# Patient Record
Sex: Male | Born: 1940
Health system: Southern US, Community
[De-identification: ages and names within clinical notes are randomized; demographics above are authoritative.]

## PROBLEM LIST (undated history)

## (undated) DIAGNOSIS — N200 Calculus of kidney: Secondary | ICD-10-CM

---

## 1999-05-09 ENCOUNTER — Emergency Department (HOSPITAL_COMMUNITY): Admission: EM | Admit: 1999-05-09 | Discharge: 1999-05-09 | Payer: Self-pay | Admitting: Internal Medicine

## 1999-05-09 ENCOUNTER — Encounter: Payer: Self-pay | Admitting: Internal Medicine

## 1999-06-12 ENCOUNTER — Ambulatory Visit (HOSPITAL_COMMUNITY): Admission: RE | Admit: 1999-06-12 | Discharge: 1999-06-12 | Payer: Self-pay | Admitting: Urology

## 1999-06-12 ENCOUNTER — Encounter: Payer: Self-pay | Admitting: Urology

## 2005-06-25 ENCOUNTER — Ambulatory Visit (HOSPITAL_BASED_OUTPATIENT_CLINIC_OR_DEPARTMENT_OTHER): Admission: RE | Admit: 2005-06-25 | Discharge: 2005-06-25 | Payer: Self-pay

## 2005-06-25 ENCOUNTER — Ambulatory Visit (HOSPITAL_COMMUNITY): Admission: RE | Admit: 2005-06-25 | Discharge: 2005-06-25 | Payer: Self-pay

## 2011-12-20 DIAGNOSIS — H4011X0 Primary open-angle glaucoma, stage unspecified: Secondary | ICD-10-CM | POA: Diagnosis not present

## 2011-12-20 DIAGNOSIS — H409 Unspecified glaucoma: Secondary | ICD-10-CM | POA: Diagnosis not present

## 2012-07-10 DIAGNOSIS — H409 Unspecified glaucoma: Secondary | ICD-10-CM | POA: Diagnosis not present

## 2012-07-10 DIAGNOSIS — H4011X0 Primary open-angle glaucoma, stage unspecified: Secondary | ICD-10-CM | POA: Diagnosis not present

## 2012-07-14 DIAGNOSIS — Z23 Encounter for immunization: Secondary | ICD-10-CM | POA: Diagnosis not present

## 2013-01-25 DIAGNOSIS — H409 Unspecified glaucoma: Secondary | ICD-10-CM | POA: Diagnosis not present

## 2013-01-25 DIAGNOSIS — H4011X0 Primary open-angle glaucoma, stage unspecified: Secondary | ICD-10-CM | POA: Diagnosis not present

## 2013-06-11 DIAGNOSIS — Z125 Encounter for screening for malignant neoplasm of prostate: Secondary | ICD-10-CM | POA: Diagnosis not present

## 2013-06-11 DIAGNOSIS — L821 Other seborrheic keratosis: Secondary | ICD-10-CM | POA: Diagnosis not present

## 2013-06-11 DIAGNOSIS — E785 Hyperlipidemia, unspecified: Secondary | ICD-10-CM | POA: Diagnosis not present

## 2013-06-14 DIAGNOSIS — H26499 Other secondary cataract, unspecified eye: Secondary | ICD-10-CM | POA: Diagnosis not present

## 2013-06-15 ENCOUNTER — Emergency Department (HOSPITAL_COMMUNITY)
Admission: EM | Admit: 2013-06-15 | Discharge: 2013-06-15 | Disposition: A | Discharge status: Home / Self Care | Payer: Medicare Other | Attending: Emergency Medicine | Admitting: Emergency Medicine

## 2013-06-15 ENCOUNTER — Encounter (HOSPITAL_COMMUNITY): Payer: Self-pay | Admitting: Emergency Medicine

## 2013-06-15 ENCOUNTER — Emergency Department (HOSPITAL_COMMUNITY): Payer: Medicare Other

## 2013-06-15 DIAGNOSIS — N200 Calculus of kidney: Secondary | ICD-10-CM | POA: Insufficient documentation

## 2013-06-15 DIAGNOSIS — R944 Abnormal results of kidney function studies: Secondary | ICD-10-CM | POA: Diagnosis not present

## 2013-06-15 DIAGNOSIS — N201 Calculus of ureter: Secondary | ICD-10-CM | POA: Diagnosis not present

## 2013-06-15 DIAGNOSIS — N400 Enlarged prostate without lower urinary tract symptoms: Secondary | ICD-10-CM | POA: Diagnosis not present

## 2013-06-15 DIAGNOSIS — R7989 Other specified abnormal findings of blood chemistry: Secondary | ICD-10-CM

## 2013-06-15 DIAGNOSIS — K7689 Other specified diseases of liver: Secondary | ICD-10-CM | POA: Diagnosis not present

## 2013-06-15 DIAGNOSIS — N23 Unspecified renal colic: Secondary | ICD-10-CM | POA: Diagnosis not present

## 2013-06-15 DIAGNOSIS — K769 Liver disease, unspecified: Secondary | ICD-10-CM

## 2013-06-15 DIAGNOSIS — R911 Solitary pulmonary nodule: Secondary | ICD-10-CM | POA: Diagnosis not present

## 2013-06-15 HISTORY — DX: Calculus of kidney: N20.0

## 2013-06-15 LAB — POCT I-STAT TROPONIN I

## 2013-06-15 LAB — CBC WITH DIFFERENTIAL/PLATELET
Eosinophils Absolute: 0.1 10*3/uL (ref 0.0–0.7)
HCT: 44.2 % (ref 39.0–52.0)
Hemoglobin: 15.1 g/dL (ref 13.0–17.0)
Lymphocytes Relative: 7 % — ABNORMAL LOW (ref 12–46)
Lymphs Abs: 0.9 10*3/uL (ref 0.7–4.0)
MCH: 30.5 pg (ref 26.0–34.0)
Monocytes Absolute: 0.6 10*3/uL (ref 0.1–1.0)
Monocytes Relative: 5 % (ref 3–12)
Neutro Abs: 10.8 10*3/uL — ABNORMAL HIGH (ref 1.7–7.7)
Neutrophils Relative %: 88 % — ABNORMAL HIGH (ref 43–77)
Platelets: 194 10*3/uL (ref 150–400)
RBC: 4.95 MIL/uL (ref 4.22–5.81)
WBC: 12.3 10*3/uL — ABNORMAL HIGH (ref 4.0–10.5)

## 2013-06-15 LAB — URINE MICROSCOPIC-ADD ON

## 2013-06-15 LAB — COMPREHENSIVE METABOLIC PANEL
ALT: 19 U/L (ref 0–53)
Alkaline Phosphatase: 45 U/L (ref 39–117)
BUN: 19 mg/dL (ref 6–23)
Calcium: 9.3 mg/dL (ref 8.4–10.5)
Chloride: 105 mEq/L (ref 96–112)
GFR calc Af Amer: 51 mL/min — ABNORMAL LOW (ref >90)
Glucose, Bld: 134 mg/dL — ABNORMAL HIGH (ref 70–99)
Potassium: 4.2 mEq/L (ref 3.5–5.1)
Sodium: 140 mEq/L (ref 135–145)
Total Bilirubin: 0.4 mg/dL (ref 0.3–1.2)

## 2013-06-15 LAB — URINALYSIS, ROUTINE W REFLEX MICROSCOPIC
Bilirubin Urine: NEGATIVE
Ketones, ur: NEGATIVE mg/dL
Leukocytes, UA: NEGATIVE
Nitrite: NEGATIVE
Specific Gravity, Urine: 1.023 (ref 1.005–1.030)
pH: 6 (ref 5.0–8.0)

## 2013-06-15 LAB — LIPASE, BLOOD: Lipase: 41 U/L (ref 11–59)

## 2013-06-15 MED ORDER — SODIUM CHLORIDE 0.9 % IV BOLUS (SEPSIS)
1000.0000 mL | Freq: Once | INTRAVENOUS | Status: AC
  Administered 2013-06-15: 1000 mL via INTRAVENOUS

## 2013-06-15 MED ORDER — TAMSULOSIN HCL 0.4 MG PO CAPS
0.4000 mg | ORAL_CAPSULE | Freq: Once | ORAL | Status: AC
  Administered 2013-06-15: 0.4 mg via ORAL
  Filled 2013-06-15: qty 1

## 2013-06-15 MED ORDER — MORPHINE SULFATE 4 MG/ML IJ SOLN
8.0000 mg | Freq: Once | INTRAMUSCULAR | Status: AC
  Administered 2013-06-15: 8 mg via INTRAVENOUS
  Filled 2013-06-15: qty 2

## 2013-06-15 MED ORDER — PROMETHAZINE HCL 25 MG PO TABS: 25.0000 mg | ORAL_TABLET | Freq: Four times a day (QID) | ORAL | Status: DC | PRN

## 2013-06-15 MED ORDER — TAMSULOSIN HCL 0.4 MG PO CAPS: 0.4000 mg | ORAL_CAPSULE | Freq: Every day | ORAL | Status: AC

## 2013-06-15 MED ORDER — HYDROMORPHONE HCL PF 1 MG/ML IJ SOLN
1.0000 mg | INTRAMUSCULAR | Status: AC | PRN
  Administered 2013-06-15 (×2): 1 mg via INTRAVENOUS
  Filled 2013-06-15 (×2): qty 1

## 2013-06-15 MED ORDER — ONDANSETRON HCL 4 MG/2ML IJ SOLN
4.0000 mg | Freq: Once | INTRAMUSCULAR | Status: AC
  Administered 2013-06-15: 4 mg via INTRAVENOUS
  Filled 2013-06-15: qty 2

## 2013-06-15 MED ORDER — OXYCODONE-ACETAMINOPHEN 10-325 MG PO TABS: 0.5000 | ORAL_TABLET | ORAL | Status: DC | PRN

## 2013-06-15 NOTE — ED Notes (Signed)
Pt reports at 430 this morning he woke up with right sided abd pain radiating downwards, hx of kidney stones. Denies urinary frequency/bld in urine. Nad, skin warm and dry, resp e/u.

## 2013-06-15 NOTE — ED Notes (Signed)
Pt returned back from radiology.

## 2013-06-15 NOTE — ED Notes (Signed)
Pt asked for urine sample and given urinal.  

## 2013-06-15 NOTE — ED Provider Notes (Signed)
Pt seen and examined.  He is still in some pain. Some additional IV pain medications were ordered. His abdomen is benign to examine.  I discussed his radiographic findings. It made aware of his distal stone. He has low-attenuation lesions in his liver with followup MRI recommended. He has a pulmonary nodule with followup CT scan recommended. He is a large prostate. He has seen urologist regarding his prostate in the past and has no current symptoms. They did wear the following recommendations of MRI and CT regarding his liver lesions and pulmonary nodule. His wife is present in the room and they both express understanding of this. They will be sent control hydration expectant management for his ureteral stone.  Roney Marion, MD 06/15/13 [phone]

## 2013-06-15 NOTE — ED Provider Notes (Signed)
CSN: [id number]     Arrival date & time 06/15/13  7829 History   First MD Initiated Contact with Patient 06/15/13 [phone]     Chief Complaint  Patient presents with  . Abdominal Pain   (Consider location/radiation/quality/duration/timing/severity/associated sxs/prior Treatment) HPI Patient with c/o severe abdominal pain . Sudden onset this morning at 4:30 AM.  Patient was awoken from sleep with severe right upper and lower abdominal pain.  Patient describes it as "the worst pain he ever had."  The patient states it is constant however waxing and waning.  He denies any dysuria or hematuria.  Patient has chronic frequency, urgency secondary to his BPH.  He had been nausea and one episode of vomiting.  His wife states he has been complaining of intermittent sharp abdominal pains.  Patient did take a stool softener and Prilosec prior this week.  Patient saw his PCP earlier this week and had normal workup with normal lab values.  Denies any changes in stool, fevers, arthralgias, myalgias.  He states he "cannot get comfortable in any position." Denies fevers, chills, myalgias, arthralgias. Denies DOE, SOB, chest tightness or pressure, radiation to left arm, jaw or back, or diaphoresis. Denies dysuria, flank pain, suprapubic pain, frequency, urgency, or hematuria. Denies headaches, light headedness, weakness, visual disturbances.   No past medical history on file. No past surgical history on file. No family history on file. History  Substance Use Topics  . Smoking status: Not on file  . Smokeless tobacco: Not on file  . Alcohol Use: Not on file    Review of Systems Ten systems reviewed and are negative for acute change, except as noted in the HPI.   Allergies  Review of patient's allergies indicates not on file.  Home Medications  No current outpatient prescriptions on file. There were no vitals taken for this visit. Physical Exam Physical Exam  Nursing note and vitals  reviewed. Constitutional: patient appears very uncomfortable. He is rolling on the bed and pacing the floor intermittently asking for relief.v  HENT:  Head: Normocephalic and atraumatic.  Eyes: Conjunctivae normal are normal. No scleral icterus.  Neck: Normal range of motion. Neck supple.  Cardiovascular: Normal rate, regular rhythm and normal heart sounds.   Pulmonary/Chest: Effort normal and breath sounds normal. No respiratory distress.  Abdominal: Soft. There is no tenderness. No CVA tenderness.  Musculoskeletal: He exhibits no edema.  Neurological: He is alert.  Skin: Skin is warm and dry. He is not diaphoretic.  Psychiatric: His behavior is normal.    ED Course  Procedures (including critical care time) Labs Review Labs Reviewed - No data to display Imaging Review No results found.  EKG Interpretation   None       MDM   1. Kidney stone   2. Renal colic   3. Elevated serum creatinine   4. Pulmonary nodule   5. Liver lesion    Pt CT positive for  3.9 mm distal R ureteral sotne. No uropathy. Liver lesions increased since last CT  And small R pulmonary nodule.  Patiennt seen in shared visit with Dr. Fayrene Fearing. Patient informed of ALL findings including incidentals on Liver/Lung and follow up is ct in 6-12 mos plus MRI for liver.   10:30 AM Patient reassessed and his pain is reoccurring, given another MG dilaudid. Will reevaluate shortly.   Patient pain resolved. Discussed patient's elevated creatinine and need for followup and repeat labs.  Patient will be discharged with pain medication, Flomax, and antiemetics.  He is  to follow up closely with his primary care physician in the next 24-48 hours for repeat creatinine checked. Specific return precautions discussed.  Patient appears safe for discharge at this time.  Thorough review of all followup reviewed with the patient before discharge. The patient appears reasonably screened and/or stabilized for discharge and I doubt  any other medical condition or other [facility] requiring further screening, evaluation, or treatment in the ED at this time prior to discharge.   Arthor Captain, PA-C 06/15/13 1621

## 2013-06-15 NOTE — ED Notes (Signed)
Pt O2 sats dropped to 86%, pt was sleeping on his back and snoring. Wife reports he does this often at home when he is sleeping on his back. Pt placed on 1 liter/min oxygen with nasal canula. O2 sats raised to 93%. Pt denies sob.

## 2013-06-15 NOTE — ED Notes (Signed)
Discharge instructions reviewed with pt and wife. Pt verbalized understanding. 

## 2013-06-15 NOTE — ED Notes (Signed)
Pt given water, sitting up at bedside drinking some water.

## 2013-06-15 NOTE — ED Notes (Signed)
Phlebotomy at bedside.

## 2013-06-18 DIAGNOSIS — N200 Calculus of kidney: Secondary | ICD-10-CM | POA: Diagnosis not present

## 2013-06-18 DIAGNOSIS — K7689 Other specified diseases of liver: Secondary | ICD-10-CM | POA: Diagnosis not present

## 2013-06-18 DIAGNOSIS — N400 Enlarged prostate without lower urinary tract symptoms: Secondary | ICD-10-CM | POA: Diagnosis not present

## 2013-06-18 DIAGNOSIS — R911 Solitary pulmonary nodule: Secondary | ICD-10-CM | POA: Diagnosis not present

## 2013-06-20 ENCOUNTER — Other Ambulatory Visit: Payer: Self-pay | Admitting: Family Medicine

## 2013-06-20 DIAGNOSIS — K769 Liver disease, unspecified: Secondary | ICD-10-CM

## 2013-06-21 DIAGNOSIS — H26499 Other secondary cataract, unspecified eye: Secondary | ICD-10-CM | POA: Diagnosis not present

## 2013-06-25 NOTE — ED Provider Notes (Signed)
Medical screening examination/treatment/procedure(s) were conducted as a shared visit with non-physician practitioner(s) and myself.  I personally evaluated the patient during the encounter.  EKG Interpretation   None      I have read and agree with Abigail. Harris note as noted above. Need for followup discussed the patient by both myself and Arthor Captain. Patient expresses understanding of need for follow up evaluations of radiographic findings. instructions were given in written form as well.  Her evaluation here is negative for acute findings, and her symptoms have improved.  Roney Marion, MD 06/25/13 [phone]

## 2013-06-29 ENCOUNTER — Ambulatory Visit
Admission: RE | Admit: 2013-06-29 | Discharge: 2013-06-29 | Disposition: A | Discharge status: Home / Self Care | Payer: Medicare Other | Source: Ambulatory Visit | Attending: Family Medicine | Admitting: Family Medicine

## 2013-06-29 DIAGNOSIS — N281 Cyst of kidney, acquired: Secondary | ICD-10-CM | POA: Diagnosis not present

## 2013-06-29 DIAGNOSIS — K769 Liver disease, unspecified: Secondary | ICD-10-CM

## 2013-06-29 DIAGNOSIS — K7689 Other specified diseases of liver: Secondary | ICD-10-CM | POA: Diagnosis not present

## 2013-06-29 MED ORDER — GADOBENATE DIMEGLUMINE 529 MG/ML IV SOLN
20.0000 mL | Freq: Once | INTRAVENOUS | Status: AC | PRN
  Administered 2013-06-29: 20 mL via INTRAVENOUS

## 2013-07-19 DIAGNOSIS — H409 Unspecified glaucoma: Secondary | ICD-10-CM | POA: Diagnosis not present

## 2013-07-19 DIAGNOSIS — H4011X0 Primary open-angle glaucoma, stage unspecified: Secondary | ICD-10-CM | POA: Diagnosis not present

## 2013-11-22 ENCOUNTER — Other Ambulatory Visit: Payer: Self-pay | Admitting: Family Medicine

## 2013-11-22 DIAGNOSIS — R911 Solitary pulmonary nodule: Secondary | ICD-10-CM

## 2013-12-10 DIAGNOSIS — R030 Elevated blood-pressure reading, without diagnosis of hypertension: Secondary | ICD-10-CM | POA: Diagnosis not present

## 2013-12-10 DIAGNOSIS — N529 Male erectile dysfunction, unspecified: Secondary | ICD-10-CM | POA: Diagnosis not present

## 2013-12-10 DIAGNOSIS — N400 Enlarged prostate without lower urinary tract symptoms: Secondary | ICD-10-CM | POA: Diagnosis not present

## 2013-12-10 DIAGNOSIS — N189 Chronic kidney disease, unspecified: Secondary | ICD-10-CM | POA: Diagnosis not present

## 2013-12-26 ENCOUNTER — Other Ambulatory Visit: Payer: Medicare Other

## 2014-01-15 ENCOUNTER — Ambulatory Visit
Admission: RE | Admit: 2014-01-15 | Discharge: 2014-01-15 | Disposition: A | Discharge status: Home / Self Care | Payer: Medicare Other | Source: Ambulatory Visit | Attending: Family Medicine | Admitting: Family Medicine

## 2014-01-15 DIAGNOSIS — R911 Solitary pulmonary nodule: Secondary | ICD-10-CM

## 2014-01-15 DIAGNOSIS — J984 Other disorders of lung: Secondary | ICD-10-CM | POA: Diagnosis not present

## 2014-01-17 DIAGNOSIS — H409 Unspecified glaucoma: Secondary | ICD-10-CM | POA: Diagnosis not present

## 2014-01-17 DIAGNOSIS — H4011X0 Primary open-angle glaucoma, stage unspecified: Secondary | ICD-10-CM | POA: Diagnosis not present

## 2014-03-20 DIAGNOSIS — Z23 Encounter for immunization: Secondary | ICD-10-CM | POA: Diagnosis not present

## 2014-06-10 DIAGNOSIS — H40012 Open angle with borderline findings, low risk, left eye: Secondary | ICD-10-CM | POA: Diagnosis not present

## 2014-06-11 DIAGNOSIS — E785 Hyperlipidemia, unspecified: Secondary | ICD-10-CM | POA: Diagnosis not present

## 2014-06-11 DIAGNOSIS — N200 Calculus of kidney: Secondary | ICD-10-CM | POA: Diagnosis not present

## 2014-06-11 DIAGNOSIS — N400 Enlarged prostate without lower urinary tract symptoms: Secondary | ICD-10-CM | POA: Diagnosis not present

## 2014-06-11 DIAGNOSIS — H9319 Tinnitus, unspecified ear: Secondary | ICD-10-CM | POA: Diagnosis not present

## 2014-06-11 DIAGNOSIS — Z23 Encounter for immunization: Secondary | ICD-10-CM | POA: Diagnosis not present

## 2014-06-11 DIAGNOSIS — K862 Cyst of pancreas: Secondary | ICD-10-CM | POA: Diagnosis not present

## 2014-06-11 DIAGNOSIS — Z125 Encounter for screening for malignant neoplasm of prostate: Secondary | ICD-10-CM | POA: Diagnosis not present

## 2014-06-11 DIAGNOSIS — R911 Solitary pulmonary nodule: Secondary | ICD-10-CM | POA: Diagnosis not present

## 2014-06-11 DIAGNOSIS — K219 Gastro-esophageal reflux disease without esophagitis: Secondary | ICD-10-CM | POA: Diagnosis not present

## 2014-06-12 ENCOUNTER — Other Ambulatory Visit: Payer: Self-pay | Admitting: Family Medicine

## 2014-06-12 DIAGNOSIS — K862 Cyst of pancreas: Secondary | ICD-10-CM

## 2014-07-10 ENCOUNTER — Ambulatory Visit
Admission: RE | Admit: 2014-07-10 | Discharge: 2014-07-10 | Disposition: A | Discharge status: Home / Self Care | Payer: Medicare Other | Source: Ambulatory Visit | Attending: Family Medicine | Admitting: Family Medicine

## 2014-07-10 DIAGNOSIS — K7689 Other specified diseases of liver: Secondary | ICD-10-CM | POA: Diagnosis not present

## 2014-07-10 DIAGNOSIS — K862 Cyst of pancreas: Secondary | ICD-10-CM | POA: Diagnosis not present

## 2014-07-10 DIAGNOSIS — K760 Fatty (change of) liver, not elsewhere classified: Secondary | ICD-10-CM | POA: Diagnosis not present

## 2014-07-10 MED ORDER — GADOBENATE DIMEGLUMINE 529 MG/ML IV SOLN
20.0000 mL | Freq: Once | INTRAVENOUS | Status: AC | PRN
  Administered 2014-07-10: 20 mL via INTRAVENOUS

## 2014-07-15 DIAGNOSIS — M545 Low back pain: Secondary | ICD-10-CM | POA: Diagnosis not present

## 2014-09-18 DIAGNOSIS — Z86010 Personal history of colonic polyps: Secondary | ICD-10-CM | POA: Diagnosis not present

## 2014-09-18 DIAGNOSIS — K621 Rectal polyp: Secondary | ICD-10-CM | POA: Diagnosis not present

## 2014-09-18 DIAGNOSIS — K635 Polyp of colon: Secondary | ICD-10-CM | POA: Diagnosis not present

## 2014-09-18 DIAGNOSIS — Z09 Encounter for follow-up examination after completed treatment for conditions other than malignant neoplasm: Secondary | ICD-10-CM | POA: Diagnosis not present

## 2014-09-18 DIAGNOSIS — K640 First degree hemorrhoids: Secondary | ICD-10-CM | POA: Diagnosis not present

## 2014-09-18 DIAGNOSIS — D127 Benign neoplasm of rectosigmoid junction: Secondary | ICD-10-CM | POA: Diagnosis not present

## 2014-09-18 DIAGNOSIS — D124 Benign neoplasm of descending colon: Secondary | ICD-10-CM | POA: Diagnosis not present

## 2014-11-07 DIAGNOSIS — H0232 Blepharochalasis right lower eyelid: Secondary | ICD-10-CM | POA: Diagnosis not present

## 2014-11-07 DIAGNOSIS — H0234 Blepharochalasis left upper eyelid: Secondary | ICD-10-CM | POA: Diagnosis not present

## 2014-11-07 DIAGNOSIS — H0235 Blepharochalasis left lower eyelid: Secondary | ICD-10-CM | POA: Diagnosis not present

## 2014-11-07 DIAGNOSIS — H0231 Blepharochalasis right upper eyelid: Secondary | ICD-10-CM | POA: Diagnosis not present

## 2014-11-21 DIAGNOSIS — H01001 Unspecified blepharitis right upper eyelid: Secondary | ICD-10-CM | POA: Diagnosis not present

## 2014-11-21 DIAGNOSIS — H01005 Unspecified blepharitis left lower eyelid: Secondary | ICD-10-CM | POA: Diagnosis not present

## 2014-11-21 DIAGNOSIS — H01002 Unspecified blepharitis right lower eyelid: Secondary | ICD-10-CM | POA: Diagnosis not present

## 2014-11-21 DIAGNOSIS — H01004 Unspecified blepharitis left upper eyelid: Secondary | ICD-10-CM | POA: Diagnosis not present

## 2015-01-02 DIAGNOSIS — H01002 Unspecified blepharitis right lower eyelid: Secondary | ICD-10-CM | POA: Diagnosis not present

## 2015-01-02 DIAGNOSIS — H26493 Other secondary cataract, bilateral: Secondary | ICD-10-CM | POA: Diagnosis not present

## 2015-01-02 DIAGNOSIS — H01001 Unspecified blepharitis right upper eyelid: Secondary | ICD-10-CM | POA: Diagnosis not present

## 2015-01-02 DIAGNOSIS — H01003 Unspecified blepharitis right eye, unspecified eyelid: Secondary | ICD-10-CM | POA: Diagnosis not present

## 2015-01-02 DIAGNOSIS — H01004 Unspecified blepharitis left upper eyelid: Secondary | ICD-10-CM | POA: Diagnosis not present

## 2015-01-28 DIAGNOSIS — E785 Hyperlipidemia, unspecified: Secondary | ICD-10-CM | POA: Diagnosis not present

## 2015-01-28 DIAGNOSIS — N189 Chronic kidney disease, unspecified: Secondary | ICD-10-CM | POA: Diagnosis not present

## 2015-01-28 DIAGNOSIS — N400 Enlarged prostate without lower urinary tract symptoms: Secondary | ICD-10-CM | POA: Diagnosis not present

## 2015-01-28 DIAGNOSIS — Z1389 Encounter for screening for other disorder: Secondary | ICD-10-CM | POA: Diagnosis not present

## 2015-01-28 DIAGNOSIS — N529 Male erectile dysfunction, unspecified: Secondary | ICD-10-CM | POA: Diagnosis not present

## 2015-01-28 DIAGNOSIS — R911 Solitary pulmonary nodule: Secondary | ICD-10-CM | POA: Diagnosis not present

## 2015-01-29 ENCOUNTER — Other Ambulatory Visit: Payer: Self-pay | Admitting: Family Medicine

## 2015-01-29 DIAGNOSIS — R911 Solitary pulmonary nodule: Secondary | ICD-10-CM

## 2015-02-04 ENCOUNTER — Ambulatory Visit
Admission: RE | Admit: 2015-02-04 | Discharge: 2015-02-04 | Disposition: A | Discharge status: Home / Self Care | Payer: Medicare Other | Source: Ambulatory Visit | Attending: Family Medicine | Admitting: Family Medicine

## 2015-02-04 DIAGNOSIS — R918 Other nonspecific abnormal finding of lung field: Secondary | ICD-10-CM | POA: Diagnosis not present

## 2015-02-04 DIAGNOSIS — R911 Solitary pulmonary nodule: Secondary | ICD-10-CM

## 2015-04-02 DIAGNOSIS — Z23 Encounter for immunization: Secondary | ICD-10-CM | POA: Diagnosis not present

## 2015-07-24 DIAGNOSIS — H401121 Primary open-angle glaucoma, left eye, mild stage: Secondary | ICD-10-CM | POA: Diagnosis not present

## 2016-02-07 ENCOUNTER — Other Ambulatory Visit: Payer: Self-pay | Admitting: Family Medicine

## 2016-02-07 DIAGNOSIS — R911 Solitary pulmonary nodule: Secondary | ICD-10-CM

## 2016-02-16 ENCOUNTER — Ambulatory Visit
Admission: RE | Admit: 2016-02-16 | Discharge: 2016-02-16 | Disposition: A | Discharge status: Home / Self Care | Payer: Medicare Other | Source: Ambulatory Visit | Attending: Family Medicine | Admitting: Family Medicine

## 2016-02-16 DIAGNOSIS — R911 Solitary pulmonary nodule: Secondary | ICD-10-CM

## 2016-02-16 DIAGNOSIS — R918 Other nonspecific abnormal finding of lung field: Secondary | ICD-10-CM | POA: Diagnosis not present

## 2016-03-08 DIAGNOSIS — E785 Hyperlipidemia, unspecified: Secondary | ICD-10-CM | POA: Diagnosis not present

## 2016-03-08 DIAGNOSIS — L821 Other seborrheic keratosis: Secondary | ICD-10-CM | POA: Diagnosis not present

## 2016-03-08 DIAGNOSIS — N189 Chronic kidney disease, unspecified: Secondary | ICD-10-CM | POA: Diagnosis not present

## 2016-03-08 DIAGNOSIS — L820 Inflamed seborrheic keratosis: Secondary | ICD-10-CM | POA: Diagnosis not present

## 2016-03-08 DIAGNOSIS — Z125 Encounter for screening for malignant neoplasm of prostate: Secondary | ICD-10-CM | POA: Diagnosis not present

## 2016-03-11 DIAGNOSIS — H01002 Unspecified blepharitis right lower eyelid: Secondary | ICD-10-CM | POA: Diagnosis not present

## 2016-03-11 DIAGNOSIS — H01004 Unspecified blepharitis left upper eyelid: Secondary | ICD-10-CM | POA: Diagnosis not present

## 2016-03-11 DIAGNOSIS — M3500 Sicca syndrome, unspecified: Secondary | ICD-10-CM | POA: Diagnosis not present

## 2016-03-11 DIAGNOSIS — Z23 Encounter for immunization: Secondary | ICD-10-CM | POA: Diagnosis not present

## 2016-03-11 DIAGNOSIS — H01005 Unspecified blepharitis left lower eyelid: Secondary | ICD-10-CM | POA: Diagnosis not present

## 2016-03-11 DIAGNOSIS — H401121 Primary open-angle glaucoma, left eye, mild stage: Secondary | ICD-10-CM | POA: Diagnosis not present

## 2016-03-11 DIAGNOSIS — H01001 Unspecified blepharitis right upper eyelid: Secondary | ICD-10-CM | POA: Diagnosis not present

## 2016-03-18 ENCOUNTER — Other Ambulatory Visit: Payer: Self-pay

## 2016-09-16 DIAGNOSIS — H01002 Unspecified blepharitis right lower eyelid: Secondary | ICD-10-CM | POA: Diagnosis not present

## 2016-09-16 DIAGNOSIS — H401121 Primary open-angle glaucoma, left eye, mild stage: Secondary | ICD-10-CM | POA: Diagnosis not present

## 2016-09-16 DIAGNOSIS — H01001 Unspecified blepharitis right upper eyelid: Secondary | ICD-10-CM | POA: Diagnosis not present

## 2017-03-24 DIAGNOSIS — H01004 Unspecified blepharitis left upper eyelid: Secondary | ICD-10-CM | POA: Diagnosis not present

## 2017-03-24 DIAGNOSIS — H01005 Unspecified blepharitis left lower eyelid: Secondary | ICD-10-CM | POA: Diagnosis not present

## 2017-03-24 DIAGNOSIS — H401121 Primary open-angle glaucoma, left eye, mild stage: Secondary | ICD-10-CM | POA: Diagnosis not present

## 2017-03-24 DIAGNOSIS — H01002 Unspecified blepharitis right lower eyelid: Secondary | ICD-10-CM | POA: Diagnosis not present

## 2017-03-24 DIAGNOSIS — H01001 Unspecified blepharitis right upper eyelid: Secondary | ICD-10-CM | POA: Diagnosis not present

## 2017-04-01 DIAGNOSIS — Z23 Encounter for immunization: Secondary | ICD-10-CM | POA: Diagnosis not present

## 2017-05-03 DIAGNOSIS — N529 Male erectile dysfunction, unspecified: Secondary | ICD-10-CM | POA: Diagnosis not present

## 2017-05-03 DIAGNOSIS — Z125 Encounter for screening for malignant neoplasm of prostate: Secondary | ICD-10-CM | POA: Diagnosis not present

## 2017-05-03 DIAGNOSIS — E782 Mixed hyperlipidemia: Secondary | ICD-10-CM | POA: Diagnosis not present

## 2017-05-03 DIAGNOSIS — E6609 Other obesity due to excess calories: Secondary | ICD-10-CM | POA: Diagnosis not present

## 2017-05-03 DIAGNOSIS — N400 Enlarged prostate without lower urinary tract symptoms: Secondary | ICD-10-CM | POA: Diagnosis not present

## 2017-05-03 DIAGNOSIS — R911 Solitary pulmonary nodule: Secondary | ICD-10-CM | POA: Diagnosis not present

## 2017-05-03 DIAGNOSIS — Z6833 Body mass index (BMI) 33.0-33.9, adult: Secondary | ICD-10-CM | POA: Diagnosis not present

## 2017-05-03 DIAGNOSIS — Z1389 Encounter for screening for other disorder: Secondary | ICD-10-CM | POA: Diagnosis not present

## 2017-05-03 DIAGNOSIS — N189 Chronic kidney disease, unspecified: Secondary | ICD-10-CM | POA: Diagnosis not present

## 2017-05-06 DIAGNOSIS — Z125 Encounter for screening for malignant neoplasm of prostate: Secondary | ICD-10-CM | POA: Diagnosis not present

## 2017-05-06 DIAGNOSIS — N189 Chronic kidney disease, unspecified: Secondary | ICD-10-CM | POA: Diagnosis not present

## 2017-05-06 DIAGNOSIS — E782 Mixed hyperlipidemia: Secondary | ICD-10-CM | POA: Diagnosis not present

## 2017-08-22 ENCOUNTER — Other Ambulatory Visit: Payer: Self-pay | Admitting: Family Medicine

## 2017-08-22 DIAGNOSIS — R911 Solitary pulmonary nodule: Secondary | ICD-10-CM

## 2017-09-19 ENCOUNTER — Other Ambulatory Visit: Payer: Medicare Other

## 2017-09-22 DIAGNOSIS — H01001 Unspecified blepharitis right upper eyelid: Secondary | ICD-10-CM | POA: Diagnosis not present

## 2017-09-22 DIAGNOSIS — H01002 Unspecified blepharitis right lower eyelid: Secondary | ICD-10-CM | POA: Diagnosis not present

## 2017-09-22 DIAGNOSIS — H01004 Unspecified blepharitis left upper eyelid: Secondary | ICD-10-CM | POA: Diagnosis not present

## 2017-09-22 DIAGNOSIS — H401121 Primary open-angle glaucoma, left eye, mild stage: Secondary | ICD-10-CM | POA: Diagnosis not present

## 2017-09-26 ENCOUNTER — Ambulatory Visit
Admission: RE | Admit: 2017-09-26 | Discharge: 2017-09-26 | Disposition: A | Discharge status: Home / Self Care | Payer: Medicare Other | Source: Ambulatory Visit | Attending: Family Medicine | Admitting: Family Medicine

## 2017-09-26 DIAGNOSIS — R911 Solitary pulmonary nodule: Secondary | ICD-10-CM

## 2017-09-26 DIAGNOSIS — R918 Other nonspecific abnormal finding of lung field: Secondary | ICD-10-CM | POA: Diagnosis not present

## 2017-11-04 DIAGNOSIS — N400 Enlarged prostate without lower urinary tract symptoms: Secondary | ICD-10-CM | POA: Diagnosis not present

## 2017-11-04 DIAGNOSIS — N189 Chronic kidney disease, unspecified: Secondary | ICD-10-CM | POA: Diagnosis not present

## 2017-11-04 DIAGNOSIS — E782 Mixed hyperlipidemia: Secondary | ICD-10-CM | POA: Diagnosis not present

## 2017-11-04 DIAGNOSIS — N529 Male erectile dysfunction, unspecified: Secondary | ICD-10-CM | POA: Diagnosis not present

## 2017-11-04 DIAGNOSIS — R911 Solitary pulmonary nodule: Secondary | ICD-10-CM | POA: Diagnosis not present

## 2017-11-04 DIAGNOSIS — K219 Gastro-esophageal reflux disease without esophagitis: Secondary | ICD-10-CM | POA: Diagnosis not present

## 2017-11-04 DIAGNOSIS — S86891A Other injury of other muscle(s) and tendon(s) at lower leg level, right leg, initial encounter: Secondary | ICD-10-CM | POA: Diagnosis not present

## 2018-03-30 DIAGNOSIS — H16223 Keratoconjunctivitis sicca, not specified as Sjogren's, bilateral: Secondary | ICD-10-CM | POA: Diagnosis not present

## 2018-03-30 DIAGNOSIS — H401121 Primary open-angle glaucoma, left eye, mild stage: Secondary | ICD-10-CM | POA: Diagnosis not present

## 2018-03-30 DIAGNOSIS — H02051 Trichiasis without entropian right upper eyelid: Secondary | ICD-10-CM | POA: Diagnosis not present

## 2018-03-30 DIAGNOSIS — Z23 Encounter for immunization: Secondary | ICD-10-CM | POA: Diagnosis not present

## 2018-03-30 DIAGNOSIS — H01001 Unspecified blepharitis right upper eyelid: Secondary | ICD-10-CM | POA: Diagnosis not present

## 2018-05-19 DIAGNOSIS — D225 Melanocytic nevi of trunk: Secondary | ICD-10-CM | POA: Diagnosis not present

## 2018-05-19 DIAGNOSIS — L821 Other seborrheic keratosis: Secondary | ICD-10-CM | POA: Diagnosis not present

## 2018-07-13 DIAGNOSIS — N529 Male erectile dysfunction, unspecified: Secondary | ICD-10-CM | POA: Diagnosis not present

## 2018-07-13 DIAGNOSIS — N400 Enlarged prostate without lower urinary tract symptoms: Secondary | ICD-10-CM | POA: Diagnosis not present

## 2018-07-13 DIAGNOSIS — E782 Mixed hyperlipidemia: Secondary | ICD-10-CM | POA: Diagnosis not present

## 2018-07-13 DIAGNOSIS — N189 Chronic kidney disease, unspecified: Secondary | ICD-10-CM | POA: Diagnosis not present

## 2018-10-16 DIAGNOSIS — R946 Abnormal results of thyroid function studies: Secondary | ICD-10-CM | POA: Diagnosis not present

## 2018-12-20 DIAGNOSIS — H01001 Unspecified blepharitis right upper eyelid: Secondary | ICD-10-CM | POA: Diagnosis not present

## 2018-12-20 DIAGNOSIS — H01002 Unspecified blepharitis right lower eyelid: Secondary | ICD-10-CM | POA: Diagnosis not present

## 2018-12-20 DIAGNOSIS — H401121 Primary open-angle glaucoma, left eye, mild stage: Secondary | ICD-10-CM | POA: Diagnosis not present

## 2018-12-20 DIAGNOSIS — H01004 Unspecified blepharitis left upper eyelid: Secondary | ICD-10-CM | POA: Diagnosis not present

## 2019-01-11 DIAGNOSIS — M1990 Unspecified osteoarthritis, unspecified site: Secondary | ICD-10-CM | POA: Diagnosis not present

## 2019-01-11 DIAGNOSIS — N189 Chronic kidney disease, unspecified: Secondary | ICD-10-CM | POA: Diagnosis not present

## 2019-01-11 DIAGNOSIS — E782 Mixed hyperlipidemia: Secondary | ICD-10-CM | POA: Diagnosis not present

## 2019-01-11 DIAGNOSIS — N400 Enlarged prostate without lower urinary tract symptoms: Secondary | ICD-10-CM | POA: Diagnosis not present

## 2019-01-11 DIAGNOSIS — N529 Male erectile dysfunction, unspecified: Secondary | ICD-10-CM | POA: Diagnosis not present

## 2019-01-16 DIAGNOSIS — N189 Chronic kidney disease, unspecified: Secondary | ICD-10-CM | POA: Diagnosis not present

## 2019-01-16 DIAGNOSIS — N183 Chronic kidney disease, stage 3 (moderate): Secondary | ICD-10-CM | POA: Diagnosis not present

## 2019-03-22 DIAGNOSIS — Z23 Encounter for immunization: Secondary | ICD-10-CM | POA: Diagnosis not present

## 2019-06-25 DIAGNOSIS — H0100A Unspecified blepharitis right eye, upper and lower eyelids: Secondary | ICD-10-CM | POA: Diagnosis not present

## 2019-06-25 DIAGNOSIS — H02051 Trichiasis without entropian right upper eyelid: Secondary | ICD-10-CM | POA: Diagnosis not present

## 2019-06-25 DIAGNOSIS — H401121 Primary open-angle glaucoma, left eye, mild stage: Secondary | ICD-10-CM | POA: Diagnosis not present

## 2019-06-25 DIAGNOSIS — H16223 Keratoconjunctivitis sicca, not specified as Sjogren's, bilateral: Secondary | ICD-10-CM | POA: Diagnosis not present

## 2019-07-25 ENCOUNTER — Ambulatory Visit: Payer: Medicare Other | Attending: Internal Medicine

## 2019-07-25 DIAGNOSIS — Z23 Encounter for immunization: Secondary | ICD-10-CM | POA: Insufficient documentation

## 2019-07-25 NOTE — Progress Notes (Signed)
   Covid-19 Vaccination Clinic  Name:  BRIJESH VITE    MRN: [id number] DOB: 12/05/1940  07/25/2019  Mr. Hoe was observed post Covid-19 immunization for 15 minutes without incidence. He was provided with Vaccine Information Sheet and instruction to access the V-Safe system.   Mr. Spielberger was instructed to call 911 with any severe reactions post vaccine: Marland Kitchen Difficulty breathing  . Swelling of your face and throat  . A fast heartbeat  . A bad rash all over your body  . Dizziness and weakness    Immunizations Administered    Name Date Dose VIS Date Route   Pfizer COVID-19 Vaccine 07/25/2019  9:46 AM 0.3 mL 06/22/2019 Intramuscular   Manufacturer: [place]   Lot: [id number]   [place]: [id number]

## 2019-08-14 ENCOUNTER — Ambulatory Visit: Payer: Medicare Other | Attending: Internal Medicine

## 2019-08-14 DIAGNOSIS — Z23 Encounter for immunization: Secondary | ICD-10-CM | POA: Insufficient documentation

## 2019-08-14 NOTE — Progress Notes (Signed)
   Covid-19 Vaccination Clinic  Name:  Kristopher Padilla    MRN: [id number] DOB: [birth year]  08/14/2019  [name] was observed post Covid-19 immunization for 15 minutes without incidence. He was provided with Vaccine Information Sheet and instruction to access the V-Safe system.   [name] was instructed to call 911 with any severe reactions post vaccine: Marland Kitchen Difficulty breathing  . Swelling of your face and throat  . A fast heartbeat  . A bad rash all over your body  . Dizziness and weakness    Immunizations Administered    Name Date Dose VIS Date Route   Pfizer COVID-19 Vaccine 08/14/2019  9:23 AM 0.3 mL 06/22/2019 Intramuscular   Manufacturer: [place]   Lot: [id number]   [place]: [id number]

## 2019-10-13 ENCOUNTER — Encounter (HOSPITAL_COMMUNITY): Payer: Self-pay

## 2019-10-13 ENCOUNTER — Emergency Department (HOSPITAL_COMMUNITY): Payer: Medicare Other

## 2019-10-13 ENCOUNTER — Emergency Department (HOSPITAL_COMMUNITY)
Admission: EM | Admit: 2019-10-13 | Discharge: 2019-10-13 | Disposition: A | Discharge status: Home / Self Care | Payer: Medicare Other | Attending: Emergency Medicine | Admitting: Emergency Medicine

## 2019-10-13 DIAGNOSIS — R109 Unspecified abdominal pain: Secondary | ICD-10-CM | POA: Diagnosis not present

## 2019-10-13 DIAGNOSIS — M545 Low back pain, unspecified: Secondary | ICD-10-CM

## 2019-10-13 DIAGNOSIS — Z79899 Other long term (current) drug therapy: Secondary | ICD-10-CM | POA: Diagnosis not present

## 2019-10-13 DIAGNOSIS — N281 Cyst of kidney, acquired: Secondary | ICD-10-CM | POA: Diagnosis not present

## 2019-10-13 LAB — COMPREHENSIVE METABOLIC PANEL
ALT: 21 U/L (ref 0–44)
AST: 19 U/L (ref 15–41)
Albumin: 3.8 g/dL (ref 3.5–5.0)
Alkaline Phosphatase: 44 U/L (ref 38–126)
Anion gap: 9 (ref 5–15)
BUN: 21 mg/dL (ref 8–23)
CO2: 26 mmol/L (ref 22–32)
Calcium: 9.4 mg/dL (ref 8.9–10.3)
Chloride: 103 mmol/L (ref 98–111)
Creatinine, Ser: 1.28 mg/dL — ABNORMAL HIGH (ref 0.61–1.24)
GFR calc Af Amer: >60 mL/min (ref >60)
GFR calc non Af Amer: 54 mL/min — ABNORMAL LOW (ref >60)
Glucose, Bld: 111 mg/dL — ABNORMAL HIGH (ref 70–99)
Potassium: 4.4 mmol/L (ref 3.5–5.1)
Sodium: 138 mmol/L (ref 135–145)
Total Bilirubin: 0.6 mg/dL (ref 0.3–1.2)
Total Protein: 6.5 g/dL (ref 6.5–8.1)

## 2019-10-13 LAB — URINALYSIS, ROUTINE W REFLEX MICROSCOPIC
Bilirubin Urine: NEGATIVE
Glucose, UA: NEGATIVE mg/dL
Hgb urine dipstick: NEGATIVE
Ketones, ur: NEGATIVE mg/dL
Leukocytes,Ua: NEGATIVE
Nitrite: NEGATIVE
Protein, ur: NEGATIVE mg/dL
Specific Gravity, Urine: 1.013 (ref 1.005–1.030)
pH: 5 (ref 5.0–8.0)

## 2019-10-13 LAB — CBC
HCT: 45 % (ref 39.0–52.0)
Hemoglobin: 14.9 g/dL (ref 13.0–17.0)
MCH: 30.2 pg (ref 26.0–34.0)
MCHC: 33.1 g/dL (ref 30.0–36.0)
MCV: 91.3 fL (ref 80.0–100.0)
Platelets: 213 10*3/uL (ref 150–400)
RBC: 4.93 MIL/uL (ref 4.22–5.81)
RDW: 11.9 % (ref 11.5–15.5)
WBC: 7.6 10*3/uL (ref 4.0–10.5)
nRBC: 0 % (ref 0.0–0.2)

## 2019-10-13 LAB — LIPASE, BLOOD: Lipase: 60 U/L — ABNORMAL HIGH (ref 11–51)

## 2019-10-13 MED ORDER — ONDANSETRON HCL 4 MG/2ML IJ SOLN
4.0000 mg | Freq: Once | INTRAMUSCULAR | Status: AC
  Administered 2019-10-13: 4 mg via INTRAVENOUS
  Filled 2019-10-13: qty 2

## 2019-10-13 MED ORDER — SODIUM CHLORIDE 0.9% FLUSH: 3.0000 mL | Freq: Once | INTRAVENOUS | Status: DC

## 2019-10-13 MED ORDER — MORPHINE SULFATE (PF) 4 MG/ML IV SOLN
4.0000 mg | Freq: Once | INTRAVENOUS | Status: AC
  Administered 2019-10-13: 4 mg via INTRAVENOUS
  Filled 2019-10-13: qty 1

## 2019-10-13 NOTE — ED Triage Notes (Signed)
Onset 2 days left flank pain.  No dysuria, hematuria, or fevers.

## 2019-10-13 NOTE — ED Provider Notes (Signed)
[facility] EMERGENCY DEPARTMENT Provider Note   CSN: [id number] Arrival date & time: 10/13/19  1221     History Chief Complaint  Patient presents with  . Flank Pain    Kristopher Padilla is a 79 y.o. male.  HPI   Patient presents to the emergency room for evaluation of left-sided flank pain.  Patient states he started having the symptoms a couple days ago.  It is about a 5 or 6 out of 10 in severity.  He is not having any dysuria hematuria or fevers.  He has had kidney stones before though and this feels very similar.  Patient has not taken anything for pain.  He came to the ED because he wanted to make sure it did not get as bad as it has in the past.  He has seen urology and has had to have some type of procedure to remove the kidney stones.  Past Medical History:  Diagnosis Date  . Kidney stones     There are no problems to display for this patient.   History reviewed. No pertinent surgical history.     History reviewed. No pertinent family history.  Social History   Tobacco Use  . Smoking status: Never Smoker  Substance Use Topics  . Alcohol use: No  . Drug use: Not on file    Home Medications Prior to Admission medications   Medication Sig Start Date End Date Taking? Authorizing Provider  glucosamine-chondroitin 500-400 MG tablet Take 1 tablet by mouth 3 (three) times daily.   Yes [provider]  Multiple Vitamins-Minerals (SENIOR MULTIVITAMIN PLUS PO) Take 1 Dose by mouth daily.   Yes [provider]  Red Yeast Rice Extract (RED YEAST RICE PO) Take 1 Dose by mouth daily.   Yes [provider]  tamsulosin (FLOMAX) 0.4 MG CAPS capsule Take 1 capsule (0.4 mg total) by mouth daily after breakfast. 06/15/13  Yes Harris, Abigail, PA-C  Travoprost, BAK Free, (TRAVATAN) 0.004 % SOLN ophthalmic solution Place 1 drop into both eyes at bedtime.   Yes [provider]  oxyCODONE-acetaminophen (PERCOCET) 10-325 MG per tablet  Take 0.5-1 tablets by mouth every 4 (four) hours as needed for pain. Patient not taking: Reported on 10/13/2019 06/15/13   Margarita Mail, PA-C  promethazine (PHENERGAN) 25 MG tablet Take 1 tablet (25 mg total) by mouth every 6 (six) hours as needed for nausea or vomiting. Patient not taking: Reported on 10/13/2019 06/15/13   Margarita Mail, PA-C    Allergies    Patient has no known allergies.  Review of Systems   Review of Systems  All other systems reviewed and are negative.   Physical Exam Updated Vital Signs BP 135/71 (BP Location: Right Arm)   Pulse (!) 51   Temp 98.3 F (36.8 C) (Oral)   Resp 17   Ht 1.778 m (5\' 10" )   Wt 97.5 kg   SpO2 97%   BMI 30.85 kg/m   Physical Exam Vitals and nursing note reviewed.  Constitutional:      General: He is not in acute distress.    Appearance: He is well-developed.  HENT:     Head: Normocephalic and atraumatic.     Right Ear: External ear normal.     Left Ear: External ear normal.  Eyes:     General: No scleral icterus.       Right eye: No discharge.        Left eye: No discharge.  Conjunctiva/sclera: Conjunctivae normal.  Neck:     Trachea: No tracheal deviation.  Cardiovascular:     Rate and Rhythm: Normal rate and regular rhythm.  Pulmonary:     Effort: Pulmonary effort is normal. No respiratory distress.     Breath sounds: Normal breath sounds. No stridor. No wheezing or rales.  Abdominal:     General: Bowel sounds are normal. There is no distension.     Palpations: Abdomen is soft.     Tenderness: There is no abdominal tenderness. There is no guarding or rebound.  Musculoskeletal:        General: No tenderness.     Cervical back: Neck supple.     Comments: Tenderness palpation left flank and paraspinal lumbar region  Skin:    General: Skin is warm and dry.     Findings: No rash.  Neurological:     Mental Status: He is alert.     Cranial Nerves: No cranial nerve deficit (no facial droop, extraocular movements  intact, no slurred speech).     Sensory: No sensory deficit.     Motor: No abnormal muscle tone or seizure activity.     Coordination: Coordination normal.     ED Results / Procedures / Treatments   Labs (all labs ordered are listed, but only abnormal results are displayed) Labs Reviewed  LIPASE, BLOOD - Abnormal; Notable for the following components:      Result Value   Lipase 60 (*)    All other components within normal limits  COMPREHENSIVE METABOLIC PANEL - Abnormal; Notable for the following components:   Glucose, Bld 111 (*)    Creatinine, Ser 1.28 (*)    GFR calc non Af Amer 54 (*)    All other components within normal limits  CBC  URINALYSIS, ROUTINE W REFLEX MICROSCOPIC    EKG None  Radiology CT Renal Stone Study  Result Date: 10/13/2019 CLINICAL DATA:  Left-sided flank pain for 2 days. Previous history of kidney stones. EXAM: CT ABDOMEN AND PELVIS WITHOUT CONTRAST TECHNIQUE: Multidetector CT imaging of the abdomen and pelvis was performed following the standard protocol without IV contrast. COMPARISON:  06/15/2013 FINDINGS: Lower chest: 2 small benign stable nodules in the right lower lobe. No acute findings. Heart is normal in size. Hepatobiliary: Multiple low-density liver masses, stable consistent with cysts. Liver normal in size. Normal gallbladder. No bile duct dilation. Pancreas: Unremarkable. No pancreatic ductal dilatation or surrounding inflammatory changes. Spleen: Normal in size without focal abnormality. Adrenals/Urinary Tract: No adrenal masses. Mild renal cortical thinning bilaterally. No renal masses. No stones. Bilateral low-attenuation renal sinus masses consistent renal sinus cysts. No convincing hydronephrosis. This appearance is stable from prior studies. Normal ureters. Normal bladder. Stomach/Bowel: Normal stomach. Small bowel and colon are normal in caliber. No wall thickening. No inflammation. Normal appendix visualized. Vascular/Lymphatic: Aortic  atherosclerosis. No aneurysm. No enlarged abdominal or pelvic lymph nodes. Reproductive: Enlarged prostate measuring 6 x 4.7 x 5.9 cm. Other: No abdominal wall hernia or abnormality. No abdominopelvic ascites. Musculoskeletal: No fracture or acute finding. No osteoblastic or osteolytic lesions. IMPRESSION: 1. No acute findings. No ureteral stones or obstructive uropathy. No findings to account for flank pain. 2. Bilateral renal sinus cysts. 3. Multiple liver cysts. 4. Aortic atherosclerosis. Electronically Signed   By: Lajean Manes M.D.   On: 10/13/2019 14:14    Procedures Procedures (including critical care time)  Medications Ordered in ED Medications  sodium chloride flush (NS) 0.9 % injection 3 mL (3 mLs Intravenous Not  Given 10/13/19 1255)  morphine 4 MG/ML injection 4 mg (4 mg Intravenous Given 10/13/19 1329)  ondansetron (ZOFRAN) injection 4 mg (4 mg Intravenous Given 10/13/19 1324)    ED Course  I have reviewed the triage vital signs and the nursing notes.  Pertinent labs & imaging results that were available during my care of the patient were reviewed by me and considered in my medical decision making (see chart for details).  Clinical Course as of Oct 13 1531  Sat Oct 13, 2019  1501 CT scan does not show any acute findings.  Urinalysis does not show infection.  Lipase mildly elevated but I do not think this is clinically significant.   [JK]  1502 Creatinine elevated but stable   [JK]    Clinical Course User Index [JK] Dorie Rank, MD   MDM Rules/Calculators/A&P                      Patient presented to ED with complaints of back pain that he felt was consistent with kidney stones.  Patient was not having any other constitutional symptoms.  No rash.  No abdominal pain.  No findings to suggest diverticulitis or shingles.  CT scan does not actually show any ureteral stones.  No other acute findings to account for his pain.  Symptoms most likely musculoskeletal in nature.  Discussed  over-the-counter pain medications.  Warning signs and precautions discussed. Final Clinical Impression(s) / ED Diagnoses Final diagnoses:  Acute left-sided low back pain without sciatica  Flank pain    Rx / DC Orders ED Discharge Orders    None       Dorie Rank, MD 10/13/19 1535

## 2019-10-13 NOTE — Discharge Instructions (Addendum)
Take over-the-counter medications as needed for pain.  You also can try topical medications such as over the counter lidocaine patches.   follow-up with your primary care doctor to be rechecked if the symptoms are not improving.

## 2019-10-13 NOTE — ED Notes (Signed)
Pt verbalized understanding of discharge instructions. Pain management and follow up care reviewed, pt had no further questions. Pt brought to lobby via wheelchair, discharged from ED with wife.

## 2019-10-24 DIAGNOSIS — H401121 Primary open-angle glaucoma, left eye, mild stage: Secondary | ICD-10-CM | POA: Diagnosis not present

## 2019-10-24 DIAGNOSIS — H16223 Keratoconjunctivitis sicca, not specified as Sjogren's, bilateral: Secondary | ICD-10-CM | POA: Diagnosis not present

## 2019-10-24 DIAGNOSIS — H02051 Trichiasis without entropian right upper eyelid: Secondary | ICD-10-CM | POA: Diagnosis not present

## 2019-10-24 DIAGNOSIS — H0100A Unspecified blepharitis right eye, upper and lower eyelids: Secondary | ICD-10-CM | POA: Diagnosis not present

## 2020-01-04 DIAGNOSIS — D1801 Hemangioma of skin and subcutaneous tissue: Secondary | ICD-10-CM | POA: Diagnosis not present

## 2020-01-31 IMAGING — CT CT CHEST W/O CM
1 of 2 series · 14 of 30 positions shown, 18 images · non-contrast
Comparison: February 16, 2016

CLINICAL DATA: Pulmonary nodules

EXAM:
CT CHEST WITHOUT CONTRAST
TECHNIQUE: Multidetector CT imaging of the chest was performed following the
standard protocol without IV contrast.

[Series 2: chest w/(date) · axial · 0.86mm/px · z∈[-446,-158]mm · 14 of 168 slices shown, 18 images]
[im 12/168  mediastinal]
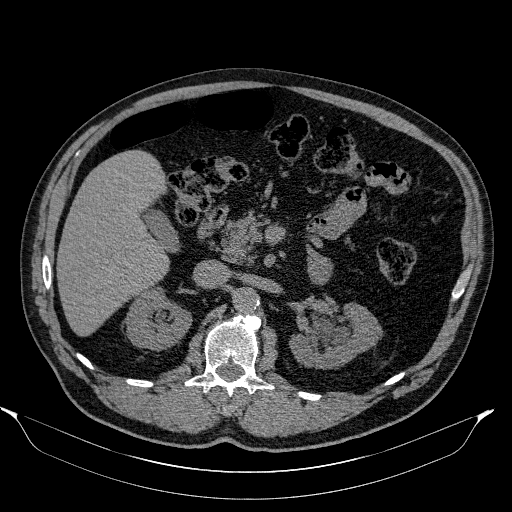
[im 12/168  lung]
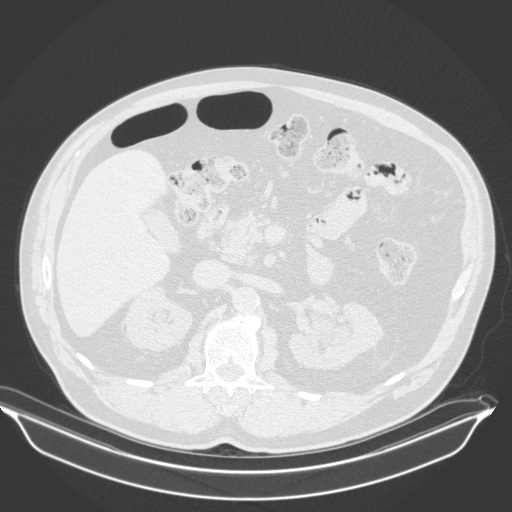
[im 24/168  lung]
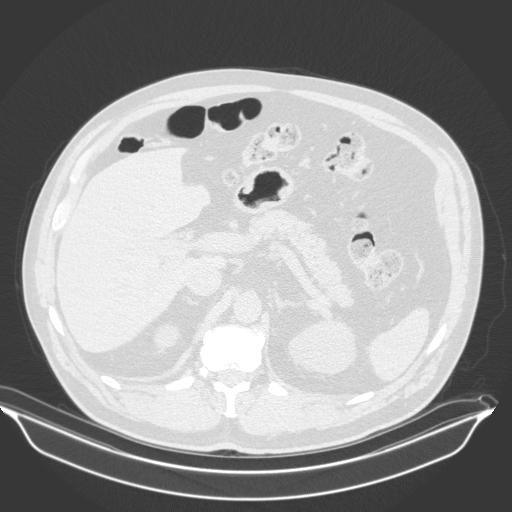
[im 36/168  lung]
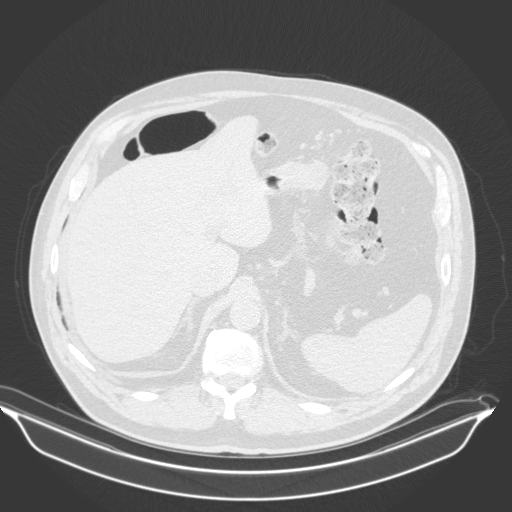
[im 48/168  lung]
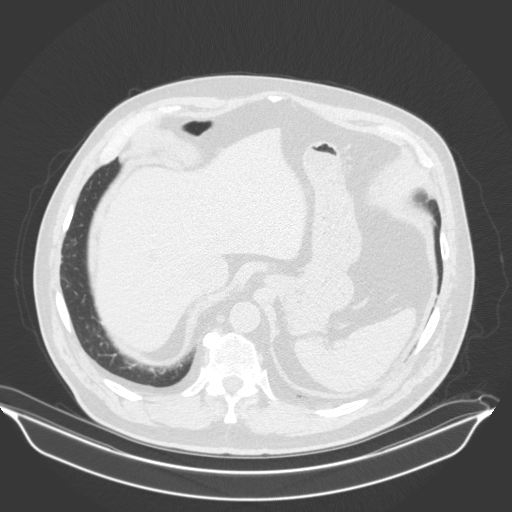
[im 60/168  mediastinal]
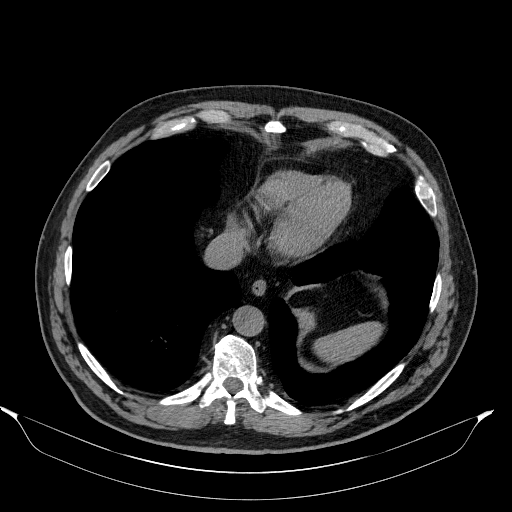
[im 60/168  lung]
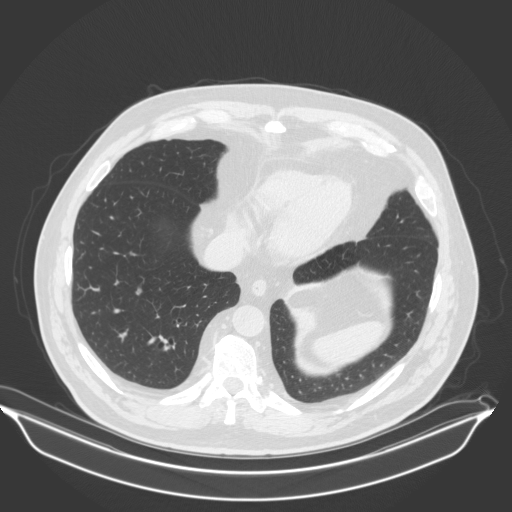
[im 72/168  lung]
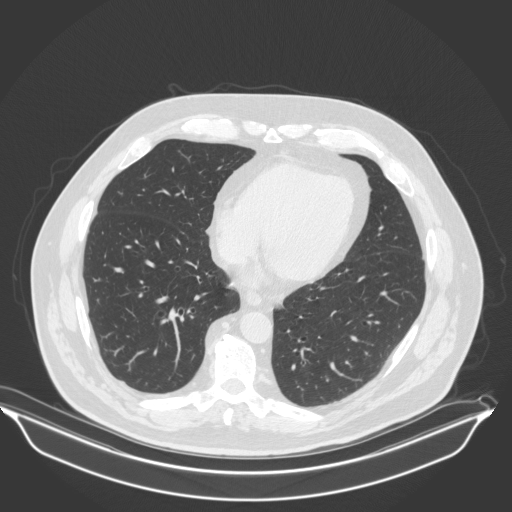
[im 80/168  lung]
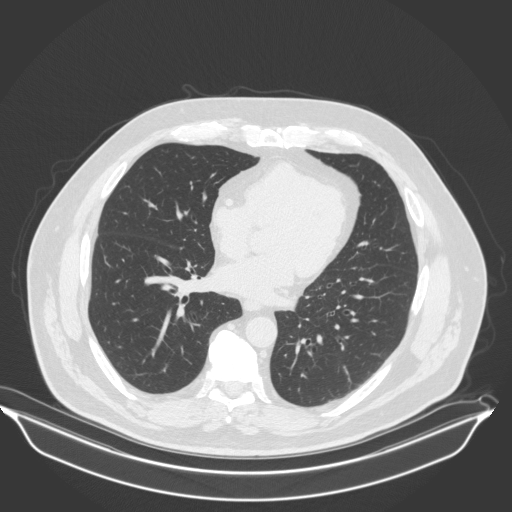
[im 84/168  lung]
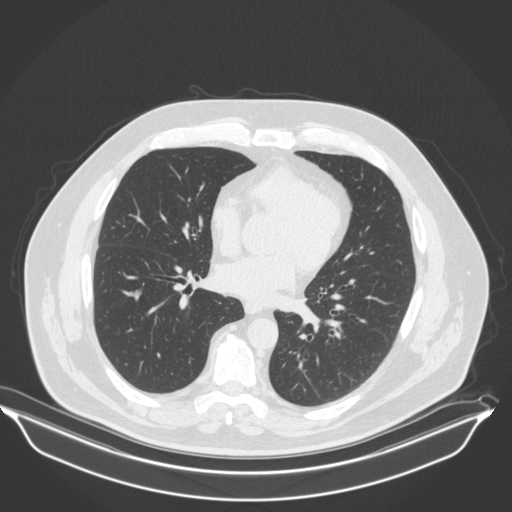
[im 96/168  mediastinal]
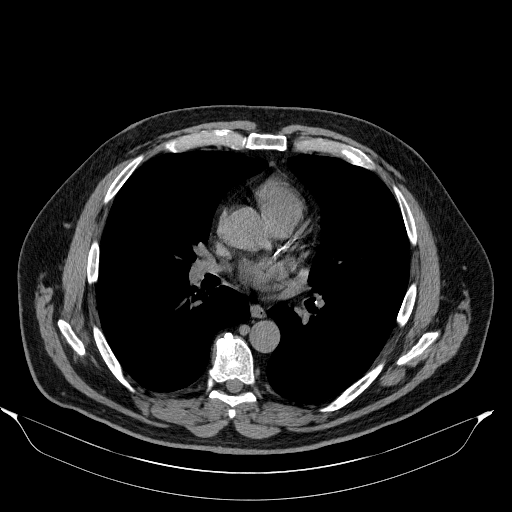
[im 96/168  lung]
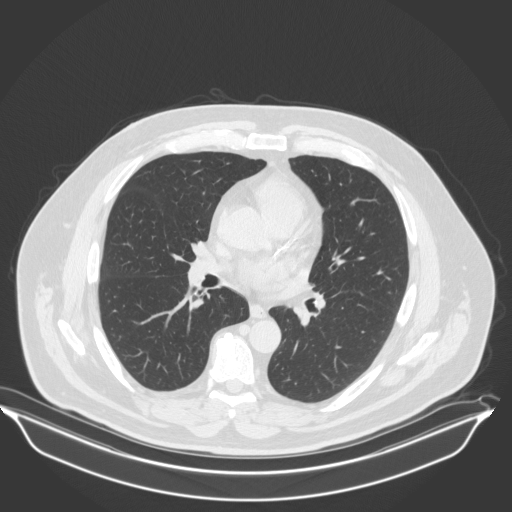
[im 108/168  lung]
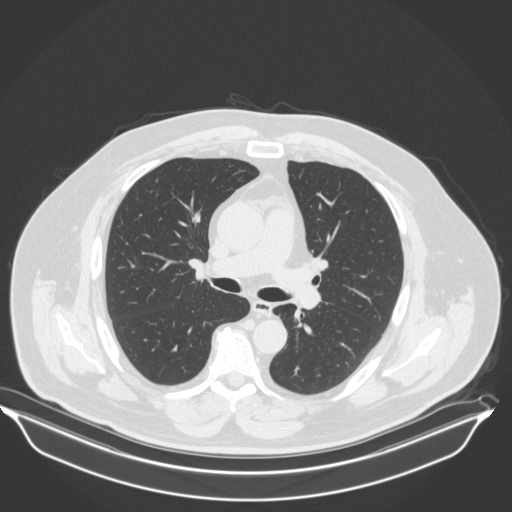
[im 120/168  lung]
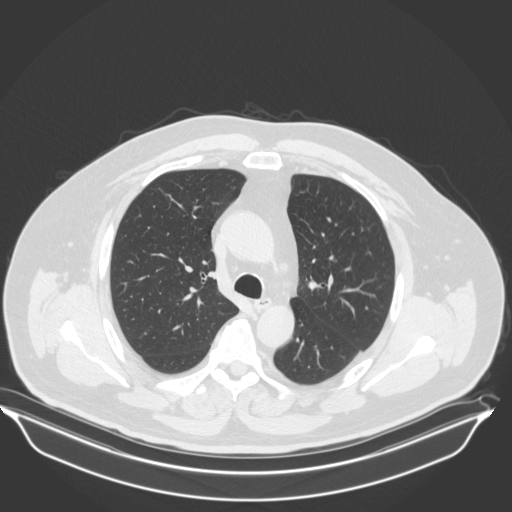
[im 132/168  lung]
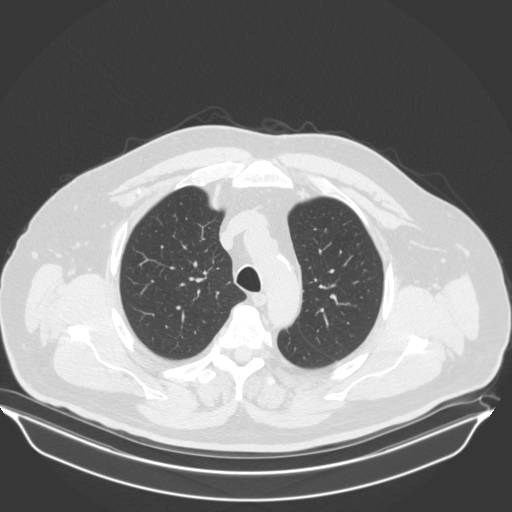
[im 144/168  mediastinal]
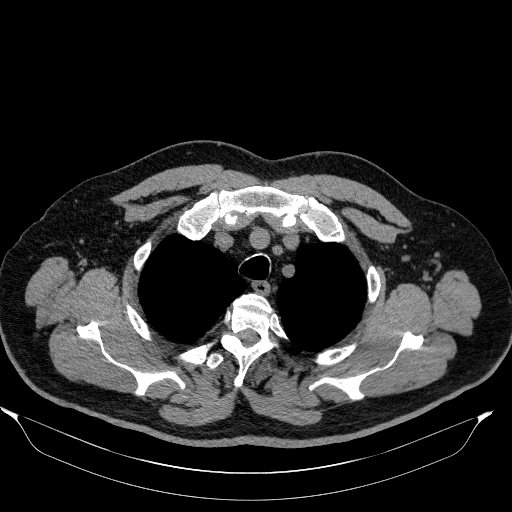
[im 144/168  lung]
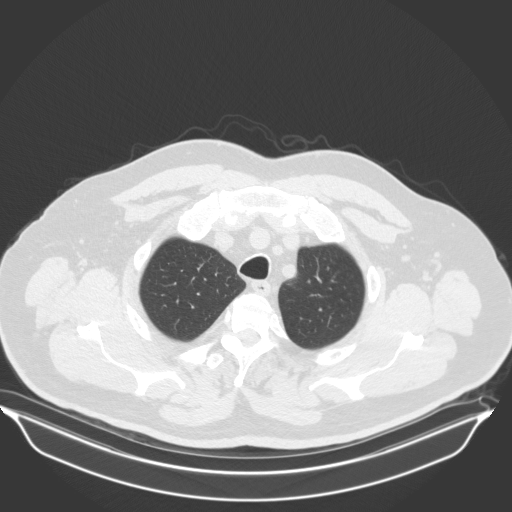
[im 156/168  lung]
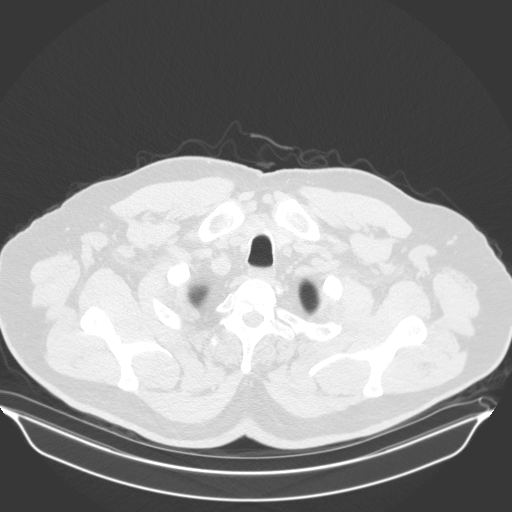

[14 of 30 positions shown; findings below may reference images not displayed]

FINDINGS: Cardiovascular: There is no appreciable thoracic aortic aneurysm or
dissection. There is slight calcification in the proximal right
subclavian artery. Other visualized great vessels appear
unremarkable on this noncontrast enhanced study. There are foci
atherosclerotic calcification in the aorta. There are scattered foci
coronary artery calcification. There is a small amount of
pericardial fluid anteriorly and superiorly.

Mediastinum/Nodes: Visualized thyroid appears normal. There is no
appreciable thoracic adenopathy. No esophageal lesions are evident.

Lungs/Pleura: On axial slice 18 series 5, there is a stable 1 mm
nodular opacity in the apical segment of the right upper lobe. On
axial slice 75 series 5, there is a stable 4 mm nodular opacity
abutting the left major fissure, a likely small intra fissural lymph
node. On axial slice 103 series 5, there is a nodular opacity
abutting the left major fissure antrum medially measuring 7 x 3 mm,
stable. On axial slice 110 series 5, there is a 4 mm nodular opacity
in the anterior segment of the right lower lobe. On axial slice 108
series 5, there is a stable nodular opacity in the lateral segment
of the right lower lobe measuring 7 x 5 mm. No new parenchymal lung
lesions are evident. There is no edema or consolidation. There is
slight lower lobe bronchiectatic change bilaterally, stable. No
pleural effusion or pleural thickening evident.

Upper Abdomen: There are again noted multiple cysts scattered
throughout the visualized liver. No new liver lesions evident. There
are apparent parapelvic cysts in each kidney, incompletely
visualized. There is aortic atherosclerosis in the upper abdomen.
Visualized upper abdominal structures otherwise appear normal.

Musculoskeletal: There is degenerative change in the thoracic spine.
There are no blastic or lytic bone lesions. There are no chest wall
lesions evident.
IMPRESSION: 1. Stable pulmonary nodular opacities an apparent intra fissural
lymph nodes. No new pulmonary nodular lesion evident.

2. Mild lower lobe bronchiectatic change bilaterally. No edema or
consolidation. No pleural effusion or pleural thickening.

3.  No appreciable thoracic adenopathy.

4. Foci of aortic atherosclerosis and coronary artery calcification
noted.

Aortic Atherosclerosis (TQ6BG-MG7.7).

## 2020-02-05 DIAGNOSIS — E782 Mixed hyperlipidemia: Secondary | ICD-10-CM | POA: Diagnosis not present

## 2020-02-05 DIAGNOSIS — M1990 Unspecified osteoarthritis, unspecified site: Secondary | ICD-10-CM | POA: Diagnosis not present

## 2020-02-05 DIAGNOSIS — S61412A Laceration without foreign body of left hand, initial encounter: Secondary | ICD-10-CM | POA: Diagnosis not present

## 2020-02-05 DIAGNOSIS — I700 Atherosclerosis of aorta: Secondary | ICD-10-CM | POA: Diagnosis not present

## 2020-02-05 DIAGNOSIS — N1831 Chronic kidney disease, stage 3a: Secondary | ICD-10-CM | POA: Diagnosis not present

## 2020-02-05 DIAGNOSIS — Z0000 Encounter for general adult medical examination without abnormal findings: Secondary | ICD-10-CM | POA: Diagnosis not present

## 2020-02-05 DIAGNOSIS — N5203 Combined arterial insufficiency and corporo-venous occlusive erectile dysfunction: Secondary | ICD-10-CM | POA: Diagnosis not present

## 2020-02-05 DIAGNOSIS — M720 Palmar fascial fibromatosis [Dupuytren]: Secondary | ICD-10-CM | POA: Diagnosis not present

## 2020-02-05 DIAGNOSIS — N400 Enlarged prostate without lower urinary tract symptoms: Secondary | ICD-10-CM | POA: Diagnosis not present

## 2020-02-20 DIAGNOSIS — M720 Palmar fascial fibromatosis [Dupuytren]: Secondary | ICD-10-CM | POA: Diagnosis not present

## 2020-02-20 DIAGNOSIS — M19042 Primary osteoarthritis, left hand: Secondary | ICD-10-CM | POA: Diagnosis not present

## 2020-02-25 DIAGNOSIS — H02051 Trichiasis without entropian right upper eyelid: Secondary | ICD-10-CM | POA: Diagnosis not present

## 2020-02-25 DIAGNOSIS — H16223 Keratoconjunctivitis sicca, not specified as Sjogren's, bilateral: Secondary | ICD-10-CM | POA: Diagnosis not present

## 2020-02-25 DIAGNOSIS — H401121 Primary open-angle glaucoma, left eye, mild stage: Secondary | ICD-10-CM | POA: Diagnosis not present

## 2020-03-12 DIAGNOSIS — H353132 Nonexudative age-related macular degeneration, bilateral, intermediate dry stage: Secondary | ICD-10-CM | POA: Diagnosis not present

## 2020-03-12 DIAGNOSIS — H44612 Retained (old) magnetic foreign body in anterior chamber, left eye: Secondary | ICD-10-CM | POA: Diagnosis not present

## 2020-03-12 DIAGNOSIS — H43813 Vitreous degeneration, bilateral: Secondary | ICD-10-CM | POA: Diagnosis not present

## 2020-03-12 DIAGNOSIS — H43393 Other vitreous opacities, bilateral: Secondary | ICD-10-CM | POA: Diagnosis not present

## 2020-03-31 DIAGNOSIS — Z23 Encounter for immunization: Secondary | ICD-10-CM | POA: Diagnosis not present

## 2020-04-22 DIAGNOSIS — Z23 Encounter for immunization: Secondary | ICD-10-CM | POA: Diagnosis not present

## 2020-06-23 DIAGNOSIS — H16223 Keratoconjunctivitis sicca, not specified as Sjogren's, bilateral: Secondary | ICD-10-CM | POA: Diagnosis not present

## 2020-06-23 DIAGNOSIS — H04123 Dry eye syndrome of bilateral lacrimal glands: Secondary | ICD-10-CM | POA: Diagnosis not present

## 2020-06-23 DIAGNOSIS — H02051 Trichiasis without entropian right upper eyelid: Secondary | ICD-10-CM | POA: Diagnosis not present

## 2020-06-23 DIAGNOSIS — H401121 Primary open-angle glaucoma, left eye, mild stage: Secondary | ICD-10-CM | POA: Diagnosis not present

## 2020-08-07 DIAGNOSIS — R310 Gross hematuria: Secondary | ICD-10-CM | POA: Diagnosis not present

## 2020-08-07 DIAGNOSIS — N400 Enlarged prostate without lower urinary tract symptoms: Secondary | ICD-10-CM | POA: Diagnosis not present

## 2020-08-07 DIAGNOSIS — R351 Nocturia: Secondary | ICD-10-CM | POA: Diagnosis not present

## 2020-08-20 DIAGNOSIS — R310 Gross hematuria: Secondary | ICD-10-CM | POA: Diagnosis not present

## 2020-08-27 DIAGNOSIS — R310 Gross hematuria: Secondary | ICD-10-CM | POA: Diagnosis not present

## 2020-08-27 DIAGNOSIS — N400 Enlarged prostate without lower urinary tract symptoms: Secondary | ICD-10-CM | POA: Diagnosis not present

## 2020-10-28 DIAGNOSIS — Z20822 Contact with and (suspected) exposure to covid-19: Secondary | ICD-10-CM | POA: Diagnosis not present

## 2020-11-01 ENCOUNTER — Telehealth: Payer: Self-pay | Admitting: Unknown Physician Specialty

## 2020-11-01 MED ORDER — MOLNUPIRAVIR EUA 200MG CAPSULE: 4.0000 | ORAL_CAPSULE | Freq: Two times a day (BID) | ORAL | 0 refills | Status: AC

## 2020-11-01 NOTE — Telephone Encounter (Signed)
Outpatient Oral COVID Treatment Note  I connected with Kristopher Padilla on 11/01/2020/10:41 AM by telephone and verified that I am speaking with the correct person using two identifiers.  I discussed the limitations, risks, security, and privacy concerns of performing an evaluation and management service by telephone and the availability of in person appointments. I also discussed with the patient that there may be a patient responsible charge related to this service. The patient expressed understanding and agreed to proceed.  Patient location: home Provider location: home  Diagnosis: COVID-19 infection  Purpose of visit: Discussion of potential use of Molnupiravir or Paxlovid, a new treatment for mild to moderate COVID-19 viral infection in non-hospitalized patients.   Subjective: Patient is a 80 y.o. male who has been diagnosed with COVID 19 viral infection.  Their symptoms began on 4/19 with congestion.    Past Medical History:  Diagnosis Date  . Kidney stones     No Known Allergies   Current Outpatient Medications:  .  glucosamine-chondroitin 500-400 MG tablet, Take 1 tablet by mouth 3 (three) times daily., Disp: , Rfl:  .  Multiple Vitamins-Minerals (SENIOR MULTIVITAMIN PLUS PO), Take 1 Dose by mouth daily., Disp: , Rfl:  .  oxyCODONE-acetaminophen (PERCOCET) 10-325 MG per tablet, Take 0.5-1 tablets by mouth every 4 (four) hours as needed for pain. (Patient not taking: Reported on 10/13/2019), Disp: 10 tablet, Rfl: 0 .  promethazine (PHENERGAN) 25 MG tablet, Take 1 tablet (25 mg total) by mouth every 6 (six) hours as needed for nausea or vomiting. (Patient not taking: Reported on 10/13/2019), Disp: 12 tablet, Rfl: 0 .  Red Yeast Rice Extract (RED YEAST RICE PO), Take 1 Dose by mouth daily., Disp: , Rfl:  .  tamsulosin (FLOMAX) 0.4 MG CAPS capsule, Take 1 capsule (0.4 mg total) by mouth daily after breakfast., Disp: 10 capsule, Rfl: 0 .  Travoprost, BAK Free, (TRAVATAN) 0.004 % SOLN ophthalmic  solution, Place 1 drop into both eyes at bedtime., Disp: , Rfl:   Objective: Patient appears/sounds well.  They are in no apparent distress.  Breathing is non labored.  Mood and behavior are normal.  Laboratory Data:  No results found for this or any previous visit (from the past 2160 hour(s)).   Assessment: 80 y.o. male with mild/moderate COVID 19 viral infection diagnosed on 4/19 at high risk for progression to severe COVID 19.  Plan:  This patient is a 80 y.o. male that meets the following criteria for Emergency Use Authorization of: Molnupiravir  1. Age >18 yr 2. SARS-COV-2 positive test 3. Symptom onset < 5 days 4. Mild-to-moderate COVID disease with high risk for severe progression to hospitalization or death   I have spoken and communicated the following to the patient or parent/caregiver regarding: 1. Molnupiravir is an unapproved drug that is authorized for use under an [employer].  2. There are no adequate, approved, available products for the treatment of COVID-19 in adults who have mild-to-moderate COVID-19 and are at high risk for progressing to severe COVID-19, including hospitalization or death. 3. Other therapeutics are currently authorized. For additional information on all products authorized for treatment or prevention of COVID-19, please see [employer].  4. There are benefits and risks of taking this treatment as outlined in the "Fact Sheet for Patients and Caregivers."  5. "Fact Sheet for Patients and Caregivers" was reviewed with patient. A hard copy will be provided to patient from pharmacy prior to the patient receiving treatment. 6. Patients should continue to self-isolate  and use infection control measures (e.g., wear mask, isolate, social distance, avoid sharing personal items, clean and disinfect "high touch" surfaces, and frequent  handwashing) according to CDC guidelines.  7. The patient or parent/caregiver has the option to accept or refuse treatment. 8. [place] [facility] has established a pregnancy surveillance program. 9. Females of childbearing potential should use a reliable method of contraception correctly and consistently, as applicable, for the duration of treatment and for 4 days after the last dose of Molnupiravir. 48. Males of reproductive potential who are sexually active with females of childbearing potential should use a reliable method of contraception correctly and consistently during treatment and for at least 3 months after the last dose. 11. Pregnancy status and risk was assessed. Patient verbalized understanding of precautions.   After reviewing above information with the patient, the patient agrees to receive molnupiravir.  Follow up instructions:    . Take prescription BID x 5 days as directed . Reach out to pharmacist for counseling on medication if desired . For concerns regarding further COVID symptoms please follow up with your PCP or urgent care . For urgent or life-threatening issues, seek care at your local emergency department  The patient was provided an opportunity to ask questions, and all were answered. The patient agreed with the plan and demonstrated an understanding of the instructions.   Script sent to  The patient was advised to call their PCP or seek an in-person evaluation if the symptoms worsen or if the condition fails to improve as anticipated.   I provided 20 minutes of non face-to-face telephone visit time during this encounter, and > 50% was spent counseling as documented under my assessment & plan.  Kathrine Haddock, NP 11/01/2020 /10:41 AM   Unable to give Paxlovid with no recent labs

## 2020-11-21 DIAGNOSIS — G5712 Meralgia paresthetica, left lower limb: Secondary | ICD-10-CM | POA: Diagnosis not present

## 2021-01-05 DIAGNOSIS — H401111 Primary open-angle glaucoma, right eye, mild stage: Secondary | ICD-10-CM | POA: Diagnosis not present

## 2021-01-05 DIAGNOSIS — H401122 Primary open-angle glaucoma, left eye, moderate stage: Secondary | ICD-10-CM | POA: Diagnosis not present

## 2021-01-05 DIAGNOSIS — H44612 Retained (old) magnetic foreign body in anterior chamber, left eye: Secondary | ICD-10-CM | POA: Diagnosis not present

## 2021-02-18 DIAGNOSIS — Z0000 Encounter for general adult medical examination without abnormal findings: Secondary | ICD-10-CM | POA: Diagnosis not present

## 2021-02-18 DIAGNOSIS — N189 Chronic kidney disease, unspecified: Secondary | ICD-10-CM | POA: Diagnosis not present

## 2021-02-18 DIAGNOSIS — W57XXXA Bitten or stung by nonvenomous insect and other nonvenomous arthropods, initial encounter: Secondary | ICD-10-CM | POA: Diagnosis not present

## 2021-02-18 DIAGNOSIS — S40862A Insect bite (nonvenomous) of left upper arm, initial encounter: Secondary | ICD-10-CM | POA: Diagnosis not present

## 2021-02-18 DIAGNOSIS — N400 Enlarged prostate without lower urinary tract symptoms: Secondary | ICD-10-CM | POA: Diagnosis not present

## 2021-02-18 DIAGNOSIS — E782 Mixed hyperlipidemia: Secondary | ICD-10-CM | POA: Diagnosis not present

## 2021-02-25 DIAGNOSIS — R310 Gross hematuria: Secondary | ICD-10-CM | POA: Diagnosis not present

## 2021-02-25 DIAGNOSIS — N400 Enlarged prostate without lower urinary tract symptoms: Secondary | ICD-10-CM | POA: Diagnosis not present

## 2021-02-25 DIAGNOSIS — N5201 Erectile dysfunction due to arterial insufficiency: Secondary | ICD-10-CM | POA: Diagnosis not present

## 2021-03-25 DIAGNOSIS — Z23 Encounter for immunization: Secondary | ICD-10-CM | POA: Diagnosis not present

## 2021-04-22 DIAGNOSIS — H401122 Primary open-angle glaucoma, left eye, moderate stage: Secondary | ICD-10-CM | POA: Diagnosis not present

## 2021-04-22 DIAGNOSIS — H44612 Retained (old) magnetic foreign body in anterior chamber, left eye: Secondary | ICD-10-CM | POA: Diagnosis not present

## 2021-04-22 DIAGNOSIS — H401111 Primary open-angle glaucoma, right eye, mild stage: Secondary | ICD-10-CM | POA: Diagnosis not present

## 2021-07-28 DIAGNOSIS — L570 Actinic keratosis: Secondary | ICD-10-CM | POA: Diagnosis not present

## 2021-07-28 DIAGNOSIS — X32XXXD Exposure to sunlight, subsequent encounter: Secondary | ICD-10-CM | POA: Diagnosis not present

## 2021-07-28 DIAGNOSIS — D1801 Hemangioma of skin and subcutaneous tissue: Secondary | ICD-10-CM | POA: Diagnosis not present

## 2021-11-23 DIAGNOSIS — H44612 Retained (old) magnetic foreign body in anterior chamber, left eye: Secondary | ICD-10-CM | POA: Diagnosis not present

## 2021-11-23 DIAGNOSIS — H16223 Keratoconjunctivitis sicca, not specified as Sjogren's, bilateral: Secondary | ICD-10-CM | POA: Diagnosis not present

## 2021-11-23 DIAGNOSIS — H401111 Primary open-angle glaucoma, right eye, mild stage: Secondary | ICD-10-CM | POA: Diagnosis not present

## 2021-11-23 DIAGNOSIS — H401122 Primary open-angle glaucoma, left eye, moderate stage: Secondary | ICD-10-CM | POA: Diagnosis not present

## 2021-11-26 DIAGNOSIS — M792 Neuralgia and neuritis, unspecified: Secondary | ICD-10-CM | POA: Diagnosis not present

## 2021-11-26 DIAGNOSIS — M21962 Unspecified acquired deformity of left lower leg: Secondary | ICD-10-CM | POA: Diagnosis not present

## 2021-11-26 DIAGNOSIS — M25372 Other instability, left ankle: Secondary | ICD-10-CM | POA: Diagnosis not present

## 2021-11-26 DIAGNOSIS — M722 Plantar fascial fibromatosis: Secondary | ICD-10-CM | POA: Diagnosis not present

## 2021-11-26 DIAGNOSIS — M7731 Calcaneal spur, right foot: Secondary | ICD-10-CM | POA: Diagnosis not present

## 2021-11-26 DIAGNOSIS — M7732 Calcaneal spur, left foot: Secondary | ICD-10-CM | POA: Diagnosis not present

## 2021-12-17 DIAGNOSIS — M21962 Unspecified acquired deformity of left lower leg: Secondary | ICD-10-CM | POA: Diagnosis not present

## 2021-12-17 DIAGNOSIS — M7732 Calcaneal spur, left foot: Secondary | ICD-10-CM | POA: Diagnosis not present

## 2021-12-17 DIAGNOSIS — M792 Neuralgia and neuritis, unspecified: Secondary | ICD-10-CM | POA: Diagnosis not present

## 2021-12-17 DIAGNOSIS — M25372 Other instability, left ankle: Secondary | ICD-10-CM | POA: Diagnosis not present

## 2021-12-17 DIAGNOSIS — M722 Plantar fascial fibromatosis: Secondary | ICD-10-CM | POA: Diagnosis not present

## 2021-12-17 DIAGNOSIS — M7731 Calcaneal spur, right foot: Secondary | ICD-10-CM | POA: Diagnosis not present

## 2022-01-14 DIAGNOSIS — M722 Plantar fascial fibromatosis: Secondary | ICD-10-CM | POA: Diagnosis not present

## 2022-01-14 DIAGNOSIS — M21962 Unspecified acquired deformity of left lower leg: Secondary | ICD-10-CM | POA: Diagnosis not present

## 2022-01-14 DIAGNOSIS — M7732 Calcaneal spur, left foot: Secondary | ICD-10-CM | POA: Diagnosis not present

## 2022-02-04 DIAGNOSIS — M722 Plantar fascial fibromatosis: Secondary | ICD-10-CM | POA: Diagnosis not present

## 2022-02-04 DIAGNOSIS — M7732 Calcaneal spur, left foot: Secondary | ICD-10-CM | POA: Diagnosis not present

## 2022-02-04 DIAGNOSIS — M21962 Unspecified acquired deformity of left lower leg: Secondary | ICD-10-CM | POA: Diagnosis not present

## 2022-02-22 DIAGNOSIS — N189 Chronic kidney disease, unspecified: Secondary | ICD-10-CM | POA: Diagnosis not present

## 2022-02-22 DIAGNOSIS — N400 Enlarged prostate without lower urinary tract symptoms: Secondary | ICD-10-CM | POA: Diagnosis not present

## 2022-02-22 DIAGNOSIS — M21962 Unspecified acquired deformity of left lower leg: Secondary | ICD-10-CM | POA: Diagnosis not present

## 2022-02-22 DIAGNOSIS — N5203 Combined arterial insufficiency and corporo-venous occlusive erectile dysfunction: Secondary | ICD-10-CM | POA: Diagnosis not present

## 2022-02-22 DIAGNOSIS — Z0000 Encounter for general adult medical examination without abnormal findings: Secondary | ICD-10-CM | POA: Diagnosis not present

## 2022-02-22 DIAGNOSIS — E782 Mixed hyperlipidemia: Secondary | ICD-10-CM | POA: Diagnosis not present

## 2022-02-22 DIAGNOSIS — M722 Plantar fascial fibromatosis: Secondary | ICD-10-CM | POA: Diagnosis not present

## 2022-02-22 DIAGNOSIS — M7732 Calcaneal spur, left foot: Secondary | ICD-10-CM | POA: Diagnosis not present

## 2022-03-08 DIAGNOSIS — N5201 Erectile dysfunction due to arterial insufficiency: Secondary | ICD-10-CM | POA: Diagnosis not present

## 2022-03-08 DIAGNOSIS — N400 Enlarged prostate without lower urinary tract symptoms: Secondary | ICD-10-CM | POA: Diagnosis not present

## 2022-03-08 DIAGNOSIS — R310 Gross hematuria: Secondary | ICD-10-CM | POA: Diagnosis not present

## 2022-03-12 DIAGNOSIS — R310 Gross hematuria: Secondary | ICD-10-CM | POA: Diagnosis not present

## 2022-03-16 DIAGNOSIS — M722 Plantar fascial fibromatosis: Secondary | ICD-10-CM | POA: Diagnosis not present

## 2022-03-16 DIAGNOSIS — M7732 Calcaneal spur, left foot: Secondary | ICD-10-CM | POA: Diagnosis not present

## 2022-03-16 DIAGNOSIS — M21962 Unspecified acquired deformity of left lower leg: Secondary | ICD-10-CM | POA: Diagnosis not present

## 2022-06-10 DIAGNOSIS — M24572 Contracture, left ankle: Secondary | ICD-10-CM | POA: Diagnosis not present

## 2022-06-10 DIAGNOSIS — M7732 Calcaneal spur, left foot: Secondary | ICD-10-CM | POA: Diagnosis not present

## 2022-06-10 DIAGNOSIS — M722 Plantar fascial fibromatosis: Secondary | ICD-10-CM | POA: Diagnosis not present

## 2022-06-10 DIAGNOSIS — M21962 Unspecified acquired deformity of left lower leg: Secondary | ICD-10-CM | POA: Diagnosis not present

## 2022-06-16 DIAGNOSIS — H16223 Keratoconjunctivitis sicca, not specified as Sjogren's, bilateral: Secondary | ICD-10-CM | POA: Diagnosis not present

## 2022-06-16 DIAGNOSIS — H401122 Primary open-angle glaucoma, left eye, moderate stage: Secondary | ICD-10-CM | POA: Diagnosis not present

## 2022-06-16 DIAGNOSIS — H401111 Primary open-angle glaucoma, right eye, mild stage: Secondary | ICD-10-CM | POA: Diagnosis not present

## 2022-06-16 DIAGNOSIS — H44612 Retained (old) magnetic foreign body in anterior chamber, left eye: Secondary | ICD-10-CM | POA: Diagnosis not present

## 2022-07-13 DIAGNOSIS — M24572 Contracture, left ankle: Secondary | ICD-10-CM | POA: Diagnosis not present

## 2022-07-13 DIAGNOSIS — M722 Plantar fascial fibromatosis: Secondary | ICD-10-CM | POA: Diagnosis not present

## 2022-07-13 DIAGNOSIS — M21962 Unspecified acquired deformity of left lower leg: Secondary | ICD-10-CM | POA: Diagnosis not present

## 2022-07-13 DIAGNOSIS — Z23 Encounter for immunization: Secondary | ICD-10-CM | POA: Diagnosis not present

## 2022-07-13 DIAGNOSIS — M7732 Calcaneal spur, left foot: Secondary | ICD-10-CM | POA: Diagnosis not present

## 2022-09-21 DIAGNOSIS — L568 Other specified acute skin changes due to ultraviolet radiation: Secondary | ICD-10-CM | POA: Diagnosis not present

## 2022-12-20 DIAGNOSIS — H401111 Primary open-angle glaucoma, right eye, mild stage: Secondary | ICD-10-CM | POA: Diagnosis not present

## 2022-12-20 DIAGNOSIS — H401122 Primary open-angle glaucoma, left eye, moderate stage: Secondary | ICD-10-CM | POA: Diagnosis not present

## 2022-12-20 DIAGNOSIS — Z961 Presence of intraocular lens: Secondary | ICD-10-CM | POA: Diagnosis not present

## 2022-12-20 DIAGNOSIS — H16223 Keratoconjunctivitis sicca, not specified as Sjogren's, bilateral: Secondary | ICD-10-CM | POA: Diagnosis not present

## 2022-12-20 DIAGNOSIS — H44612 Retained (old) magnetic foreign body in anterior chamber, left eye: Secondary | ICD-10-CM | POA: Diagnosis not present

## 2023-01-11 DIAGNOSIS — U071 COVID-19: Secondary | ICD-10-CM | POA: Diagnosis not present

## 2023-03-02 DIAGNOSIS — E782 Mixed hyperlipidemia: Secondary | ICD-10-CM | POA: Diagnosis not present

## 2023-03-02 DIAGNOSIS — N400 Enlarged prostate without lower urinary tract symptoms: Secondary | ICD-10-CM | POA: Diagnosis not present

## 2023-03-02 DIAGNOSIS — N5203 Combined arterial insufficiency and corporo-venous occlusive erectile dysfunction: Secondary | ICD-10-CM | POA: Diagnosis not present

## 2023-03-02 DIAGNOSIS — Z0000 Encounter for general adult medical examination without abnormal findings: Secondary | ICD-10-CM | POA: Diagnosis not present

## 2023-03-02 DIAGNOSIS — M5432 Sciatica, left side: Secondary | ICD-10-CM | POA: Diagnosis not present

## 2023-03-02 DIAGNOSIS — N189 Chronic kidney disease, unspecified: Secondary | ICD-10-CM | POA: Diagnosis not present

## 2023-03-15 DIAGNOSIS — N400 Enlarged prostate without lower urinary tract symptoms: Secondary | ICD-10-CM | POA: Diagnosis not present

## 2023-03-15 DIAGNOSIS — N411 Chronic prostatitis: Secondary | ICD-10-CM | POA: Diagnosis not present

## 2023-04-04 DIAGNOSIS — Z23 Encounter for immunization: Secondary | ICD-10-CM | POA: Diagnosis not present

## 2023-05-30 DIAGNOSIS — M792 Neuralgia and neuritis, unspecified: Secondary | ICD-10-CM | POA: Diagnosis not present

## 2023-05-30 DIAGNOSIS — S86892A Other injury of other muscle(s) and tendon(s) at lower leg level, left leg, initial encounter: Secondary | ICD-10-CM | POA: Diagnosis not present

## 2023-05-31 ENCOUNTER — Other Ambulatory Visit: Payer: Self-pay | Admitting: Podiatry

## 2023-05-31 ENCOUNTER — Ambulatory Visit
Admission: RE | Admit: 2023-05-31 | Discharge: 2023-05-31 | Disposition: A | Discharge status: Home / Self Care | Payer: Medicare Other | Source: Ambulatory Visit | Attending: Podiatry | Admitting: Podiatry

## 2023-05-31 DIAGNOSIS — M84369A Stress fracture, unspecified tibia and fibula, initial encounter for fracture: Secondary | ICD-10-CM

## 2023-06-14 DIAGNOSIS — D2239 Melanocytic nevi of other parts of face: Secondary | ICD-10-CM | POA: Diagnosis not present

## 2023-06-22 DIAGNOSIS — M792 Neuralgia and neuritis, unspecified: Secondary | ICD-10-CM | POA: Diagnosis not present

## 2023-06-22 DIAGNOSIS — H401111 Primary open-angle glaucoma, right eye, mild stage: Secondary | ICD-10-CM | POA: Diagnosis not present

## 2023-06-22 DIAGNOSIS — H44612 Retained (old) magnetic foreign body in anterior chamber, left eye: Secondary | ICD-10-CM | POA: Diagnosis not present

## 2023-06-22 DIAGNOSIS — H401122 Primary open-angle glaucoma, left eye, moderate stage: Secondary | ICD-10-CM | POA: Diagnosis not present

## 2023-06-29 ENCOUNTER — Other Ambulatory Visit: Payer: Self-pay

## 2023-06-29 ENCOUNTER — Emergency Department (HOSPITAL_COMMUNITY): Payer: Medicare Other

## 2023-06-29 ENCOUNTER — Emergency Department (HOSPITAL_COMMUNITY)
Admission: EM | Admit: 2023-06-29 | Discharge: 2023-06-29 | Disposition: A | Discharge status: Home / Self Care | Payer: Medicare Other | Attending: Emergency Medicine | Admitting: Emergency Medicine

## 2023-06-29 DIAGNOSIS — N400 Enlarged prostate without lower urinary tract symptoms: Secondary | ICD-10-CM | POA: Diagnosis not present

## 2023-06-29 DIAGNOSIS — N281 Cyst of kidney, acquired: Secondary | ICD-10-CM | POA: Diagnosis not present

## 2023-06-29 DIAGNOSIS — R109 Unspecified abdominal pain: Secondary | ICD-10-CM | POA: Insufficient documentation

## 2023-06-29 DIAGNOSIS — N200 Calculus of kidney: Secondary | ICD-10-CM | POA: Diagnosis not present

## 2023-06-29 DIAGNOSIS — K7689 Other specified diseases of liver: Secondary | ICD-10-CM | POA: Diagnosis not present

## 2023-06-29 DIAGNOSIS — R1084 Generalized abdominal pain: Secondary | ICD-10-CM | POA: Diagnosis not present

## 2023-06-29 DIAGNOSIS — R9431 Abnormal electrocardiogram [ECG] [EKG]: Secondary | ICD-10-CM | POA: Diagnosis not present

## 2023-06-29 LAB — COMPREHENSIVE METABOLIC PANEL
ALT: 19 U/L (ref 0–44)
AST: 26 U/L (ref 15–41)
Albumin: 4 g/dL (ref 3.5–5.0)
Alkaline Phosphatase: 43 U/L (ref 38–126)
Anion gap: 9 (ref 5–15)
BUN: 16 mg/dL (ref 8–23)
CO2: 24 mmol/L (ref 22–32)
Calcium: 9.7 mg/dL (ref 8.9–10.3)
Chloride: 102 mmol/L (ref 98–111)
Creatinine, Ser: 1.23 mg/dL (ref 0.61–1.24)
GFR, Estimated: 59 mL/min — ABNORMAL LOW (ref >60)
Glucose, Bld: 188 mg/dL — ABNORMAL HIGH (ref 70–99)
Potassium: 4.3 mmol/L (ref 3.5–5.1)
Sodium: 135 mmol/L (ref 135–145)
Total Bilirubin: 0.6 mg/dL (ref <1.2)
Total Protein: 6.8 g/dL (ref 6.5–8.1)

## 2023-06-29 LAB — URINALYSIS, ROUTINE W REFLEX MICROSCOPIC
Bacteria, UA: NONE SEEN
Bilirubin Urine: NEGATIVE
Glucose, UA: NEGATIVE mg/dL
Ketones, ur: NEGATIVE mg/dL
Leukocytes,Ua: NEGATIVE
Nitrite: NEGATIVE
Protein, ur: NEGATIVE mg/dL
Specific Gravity, Urine: 1.032 — ABNORMAL HIGH (ref 1.005–1.030)
pH: 5 (ref 5.0–8.0)

## 2023-06-29 LAB — CBC
HCT: 43.6 % (ref 39.0–52.0)
Hemoglobin: 14.9 g/dL (ref 13.0–17.0)
MCH: 30.4 pg (ref 26.0–34.0)
MCHC: 34.2 g/dL (ref 30.0–36.0)
MCV: 89 fL (ref 80.0–100.0)
Platelets: 220 10*3/uL (ref 150–400)
RBC: 4.9 MIL/uL (ref 4.22–5.81)
RDW: 11.7 % (ref 11.5–15.5)
WBC: 12.5 10*3/uL — ABNORMAL HIGH (ref 4.0–10.5)
nRBC: 0 % (ref 0.0–0.2)

## 2023-06-29 LAB — LIPASE, BLOOD: Lipase: 37 U/L (ref 11–51)

## 2023-06-29 MED ORDER — IOHEXOL 350 MG/ML SOLN
75.0000 mL | Freq: Once | INTRAVENOUS | Status: AC | PRN
  Administered 2023-06-29: 75 mL via INTRAVENOUS

## 2023-06-29 MED ORDER — FENTANYL CITRATE PF 50 MCG/ML IJ SOSY
50.0000 ug | PREFILLED_SYRINGE | Freq: Once | INTRAMUSCULAR | Status: AC
  Administered 2023-06-29: 50 ug via INTRAVENOUS
  Filled 2023-06-29: qty 1

## 2023-06-29 MED ORDER — ONDANSETRON HCL 4 MG/2ML IJ SOLN
4.0000 mg | Freq: Once | INTRAMUSCULAR | Status: AC
  Administered 2023-06-29: 4 mg via INTRAVENOUS
  Filled 2023-06-29: qty 2

## 2023-06-29 NOTE — ED Notes (Signed)
Pt ambulated to restroom without incident and provided an UA

## 2023-06-29 NOTE — ED Notes (Signed)
Provided pt ice chips 

## 2023-06-29 NOTE — ED Provider Notes (Signed)
[facility] EMERGENCY DEPARTMENT AT [facility] Provider Note   CSN: [id number] Arrival date & time: 06/29/23  0446     History  Chief Complaint  Patient presents with   Abdominal Pain    Kristopher Padilla is a 82 y.o. male.  Patient here with abdominal pain since last night.  He has had some small bowel movements yesterday.  He is had hernia surgery in the past.  He denies any chest pain or shortness of breath or weakness numbness tingling.  He has felt nauseous but has not thrown up.  He is got pain all throughout his abdomen.  Denies any difficulty with urination.  Denies any flank pain.  Does have a history of kidney stones.  Has not noticed any blood in his urine.  Denies any headache fever or chills.  The history is provided by the patient.       Home Medications Prior to Admission medications   Medication Sig Start Date End Date Taking? Authorizing Provider  glucosamine-chondroitin 500-400 MG tablet Take 1 tablet by mouth 3 (three) times daily.    [provider]  Multiple Vitamins-Minerals (SENIOR MULTIVITAMIN PLUS PO) Take 1 Dose by mouth daily.    [provider]  oxyCODONE-acetaminophen (PERCOCET) 10-325 MG per tablet Take 0.5-1 tablets by mouth every 4 (four) hours as needed for pain. Patient not taking: Reported on 10/13/2019 06/15/13   Arthor Captain, PA-C  promethazine (PHENERGAN) 25 MG tablet Take 1 tablet (25 mg total) by mouth every 6 (six) hours as needed for nausea or vomiting. Patient not taking: Reported on 10/13/2019 06/15/13   Arthor Captain, PA-C  Red Yeast Rice Extract (RED YEAST RICE PO) Take 1 Dose by mouth daily.    [provider]  tamsulosin (FLOMAX) 0.4 MG CAPS capsule Take 1 capsule (0.4 mg total) by mouth daily after breakfast. 06/15/13   Tiburcio Pea, Abigail, PA-C  Travoprost, BAK Free, (TRAVATAN) 0.004 % SOLN ophthalmic solution Place 1 drop into both eyes at bedtime.    [provider]      Allergies    Patient  has no known allergies.    Review of Systems   Review of Systems  Physical Exam Updated Vital Signs BP (!) 180/76   Pulse 72   Temp 98.3 F (36.8 C) (Oral)   Resp 18   SpO2 94%  Physical Exam Vitals and nursing note reviewed.  Constitutional:      General: He is not in acute distress.    Appearance: He is well-developed.  HENT:     Head: Normocephalic and atraumatic.     Mouth/Throat:     Mouth: Mucous membranes are moist.  Eyes:     Extraocular Movements: Extraocular movements intact.     Conjunctiva/sclera: Conjunctivae normal.     Pupils: Pupils are equal, round, and reactive to light.  Cardiovascular:     Rate and Rhythm: Normal rate and regular rhythm.     Heart sounds: Normal heart sounds. No murmur heard. Pulmonary:     Effort: Pulmonary effort is normal. No respiratory distress.     Breath sounds: Normal breath sounds.  Abdominal:     Palpations: Abdomen is soft.     Tenderness: There is abdominal tenderness.  Musculoskeletal:        General: No swelling.     Cervical back: Neck supple.  Skin:    General: Skin is warm and dry.     Capillary Refill: Capillary refill takes less than 2 seconds.  Neurological:     General: No focal deficit present.     Mental Status: He is alert.  Psychiatric:        Mood and Affect: Mood normal.     ED Results / Procedures / Treatments   Labs (all labs ordered are listed, but only abnormal results are displayed) Labs Reviewed  COMPREHENSIVE METABOLIC PANEL - Abnormal; Notable for the following components:      Result Value   Glucose, Bld 188 (*)    GFR, Estimated 59 (*)    All other components within normal limits  CBC - Abnormal; Notable for the following components:   WBC 12.5 (*)    All other components within normal limits  URINALYSIS, ROUTINE W REFLEX MICROSCOPIC - Abnormal; Notable for the following components:   Specific Gravity, Urine 1.032 (*)    Hgb urine dipstick SMALL (*)    All other components within  normal limits  LIPASE, BLOOD    EKG EKG Interpretation Date/Time:  Wednesday June 29 2023 04:31:09 EST Ventricular Rate:  63 PR Interval:  206 QRS Duration:  90 QT Interval:  402 QTC Calculation: 411 R Axis:   82  Text Interpretation: Normal sinus rhythm Normal ECG When compared with ECG of 23-Jun-2005 12:43, PREVIOUS ECG IS PRESENT Confirmed by Virgina Norfolk [phone]) on 06/29/2023 8:39:39 AM  Radiology CT ABDOMEN PELVIS W CONTRAST Result Date: 06/29/2023 CLINICAL DATA:  Abdominal pain EXAM: CT ABDOMEN AND PELVIS WITH CONTRAST TECHNIQUE: Multidetector CT imaging of the abdomen and pelvis was performed using the standard protocol following bolus administration of intravenous contrast. RADIATION DOSE REDUCTION: This exam was performed according to the departmental dose-optimization program which includes automated exposure control, adjustment of the mA and/or kV according to patient size and/or use of iterative reconstruction technique. CONTRAST:  75mL OMNIPAQUE IOHEXOL 350 MG/ML SOLN COMPARISON:  10/13/2019 FINDINGS: Lower chest: No acute abnormality. Unchanged elevation of left hemidiaphragm. Hepatobiliary: Cysts again seen throughout the liver. No suspicious hepatic lesions are present. No signal abnormality of the gallbladder or bile ducts. Pancreas: Unremarkable. Spleen: Normal. Adrenals/Urinary Tract: Adrenal glands are normal. Bilateral renal sinus cysts are again seen and do not require dedicated imaging surveillance. No hydronephrosis or hydroureter. 2 mm nonobstructing calculus present in the upper pole of the right kidney. 1 mm nonobstructing calculus seen in the mid left kidney. 3 calculi present within the dependent portion of the bladder measuring up to 6 mm. Stomach/Bowel: Stomach is within normal limits. Appendix appears normal. No evidence of bowel wall thickening, distention, or inflammatory changes. Vascular/Lymphatic: No significant vascular findings are present. No enlarged  abdominal or pelvic lymph nodes. Reproductive: Markedly enlarged prostate. Other: Small fat containing bilateral inguinal hernias. Musculoskeletal: No acute or significant osseous findings. IMPRESSION: 1. No acute abnormality of the abdomen or pelvis. 2. Markedly enlarged prostate. 3. 3 calculi present within the dependent portion of the bladder measuring up to 6 mm. 4. 2 mm nonobstructing calculus present in the upper pole of the right kidney. 1 mm nonobstructing calculus seen in the mid left kidney. Electronically Signed   By: Acquanetta Belling M.D.   On: 06/29/2023 12:21    Procedures Procedures    Medications Ordered in ED Medications  fentaNYL (SUBLIMAZE) injection 50 mcg (50 mcg Intravenous Given 06/29/23 1014)  ondansetron (ZOFRAN) injection 4 mg (4 mg Intravenous Given 06/29/23 1014)  iohexol (OMNIPAQUE) 350 MG/ML injection 75 mL (75 mLs Intravenous Contrast Given 06/29/23 1103)    ED Course/ Medical Decision Making/ A&P  Medical Decision Making Amount and/or Complexity of Data Reviewed Labs: ordered. Radiology: ordered.  Risk Prescription drug management.   Kristopher Padilla is here with abdominal pain.  History of kidney stones.  Got some elevated blood pressure otherwise normal vitals.  Diffuse abdominal pain mostly lower.  Denies any pain with urination.  No nausea or vomiting.  No chest pain or shortness of breath.  EKG per my review and interpretation shows sinus rhythm.  No ischemic changes.  Differential diagnosis likely gas related process or constipation seems less likely be kidney stone or bowel obstruction.  Have no concern for vascular process.  Seems less likely to be appendicitis.  I have no concern for ACS or PE or other acute process.  Will get CBC CMP lipase urinalysis CT scan abdomen and pelvis.  Per my review interpretation the labs no significant anemia or electrolyte abnormality or kidney injury or leukocytosis.  CT scan abdomen pelvis  is unremarkable.  There are some stones in the lower bladder so I suspect may be a passed kidney stone.  May be constipation.  But otherwise no concerning findings otherwise.  He has some nonobstructive stones bilaterally.  My suspicion overall is for passed kidney stone.  No evidence of urinary tract infection.  Recommend follow-up primary care doctor.  Continue use of Tylenol.  Understand return precautions.  This chart was dictated using voice recognition software.  Despite best efforts to proofread,  errors can occur which can change the documentation meaning.         Final Clinical Impression(s) / ED Diagnoses Final diagnoses:  Abdominal pain, unspecified abdominal location    Rx / DC Orders ED Discharge Orders     None         Virgina Norfolk, DO 06/29/23 1419

## 2023-06-29 NOTE — ED Triage Notes (Addendum)
Patient reports generalized abdominal pain this morning , no emesis or diarrhea , denies fever or chills . Last BM yesterday.

## 2023-07-05 DIAGNOSIS — N202 Calculus of kidney with calculus of ureter: Secondary | ICD-10-CM | POA: Diagnosis not present

## 2023-07-07 DIAGNOSIS — N200 Calculus of kidney: Secondary | ICD-10-CM | POA: Diagnosis not present

## 2023-07-07 DIAGNOSIS — M79605 Pain in left leg: Secondary | ICD-10-CM | POA: Diagnosis not present

## 2023-08-10 DIAGNOSIS — M7062 Trochanteric bursitis, left hip: Secondary | ICD-10-CM | POA: Diagnosis not present

## 2023-09-13 DIAGNOSIS — M25571 Pain in right ankle and joints of right foot: Secondary | ICD-10-CM | POA: Diagnosis not present

## 2023-09-13 DIAGNOSIS — M25559 Pain in unspecified hip: Secondary | ICD-10-CM | POA: Diagnosis not present

## 2023-09-13 DIAGNOSIS — M7062 Trochanteric bursitis, left hip: Secondary | ICD-10-CM | POA: Diagnosis not present

## 2023-09-22 DIAGNOSIS — M25552 Pain in left hip: Secondary | ICD-10-CM | POA: Diagnosis not present

## 2023-09-26 DIAGNOSIS — M25552 Pain in left hip: Secondary | ICD-10-CM | POA: Diagnosis not present

## 2023-09-29 DIAGNOSIS — M25552 Pain in left hip: Secondary | ICD-10-CM | POA: Diagnosis not present

## 2023-09-30 DIAGNOSIS — N189 Chronic kidney disease, unspecified: Secondary | ICD-10-CM | POA: Diagnosis not present

## 2023-10-03 DIAGNOSIS — M25552 Pain in left hip: Secondary | ICD-10-CM | POA: Diagnosis not present

## 2023-10-06 DIAGNOSIS — M25552 Pain in left hip: Secondary | ICD-10-CM | POA: Diagnosis not present

## 2023-10-10 DIAGNOSIS — E782 Mixed hyperlipidemia: Secondary | ICD-10-CM | POA: Diagnosis not present

## 2023-10-10 DIAGNOSIS — N189 Chronic kidney disease, unspecified: Secondary | ICD-10-CM | POA: Diagnosis not present

## 2023-10-10 DIAGNOSIS — N400 Enlarged prostate without lower urinary tract symptoms: Secondary | ICD-10-CM | POA: Diagnosis not present

## 2023-10-11 DIAGNOSIS — M25562 Pain in left knee: Secondary | ICD-10-CM | POA: Diagnosis not present

## 2023-10-13 DIAGNOSIS — N189 Chronic kidney disease, unspecified: Secondary | ICD-10-CM | POA: Diagnosis not present

## 2023-10-13 DIAGNOSIS — M25552 Pain in left hip: Secondary | ICD-10-CM | POA: Diagnosis not present

## 2023-10-17 DIAGNOSIS — M25552 Pain in left hip: Secondary | ICD-10-CM | POA: Diagnosis not present

## 2023-10-21 DIAGNOSIS — M25552 Pain in left hip: Secondary | ICD-10-CM | POA: Diagnosis not present

## 2023-10-24 DIAGNOSIS — M25552 Pain in left hip: Secondary | ICD-10-CM | POA: Diagnosis not present

## 2023-10-27 DIAGNOSIS — M25552 Pain in left hip: Secondary | ICD-10-CM | POA: Diagnosis not present

## 2023-10-29 DIAGNOSIS — N189 Chronic kidney disease, unspecified: Secondary | ICD-10-CM | POA: Diagnosis not present

## 2023-11-03 DIAGNOSIS — R0789 Other chest pain: Secondary | ICD-10-CM | POA: Diagnosis not present

## 2023-11-03 DIAGNOSIS — E782 Mixed hyperlipidemia: Secondary | ICD-10-CM | POA: Diagnosis not present

## 2023-11-03 DIAGNOSIS — I10 Essential (primary) hypertension: Secondary | ICD-10-CM | POA: Diagnosis not present

## 2023-11-03 DIAGNOSIS — M25552 Pain in left hip: Secondary | ICD-10-CM | POA: Diagnosis not present

## 2023-11-03 DIAGNOSIS — I700 Atherosclerosis of aorta: Secondary | ICD-10-CM | POA: Diagnosis not present

## 2023-11-07 ENCOUNTER — Ambulatory Visit: Attending: Cardiology | Admitting: Cardiology

## 2023-11-07 ENCOUNTER — Ambulatory Visit: Attending: Cardiology

## 2023-11-07 ENCOUNTER — Encounter: Payer: Self-pay | Admitting: Cardiology

## 2023-11-07 VITALS — BP 130/70 | HR 61 | Resp 16 | Ht 70.0 in | Wt 212.0 lb

## 2023-11-07 DIAGNOSIS — I209 Angina pectoris, unspecified: Secondary | ICD-10-CM | POA: Insufficient documentation

## 2023-11-07 DIAGNOSIS — R0789 Other chest pain: Secondary | ICD-10-CM | POA: Diagnosis not present

## 2023-11-07 DIAGNOSIS — I10 Essential (primary) hypertension: Secondary | ICD-10-CM | POA: Insufficient documentation

## 2023-11-07 DIAGNOSIS — I700 Atherosclerosis of aorta: Secondary | ICD-10-CM | POA: Diagnosis not present

## 2023-11-07 DIAGNOSIS — R42 Dizziness and giddiness: Secondary | ICD-10-CM | POA: Diagnosis not present

## 2023-11-07 NOTE — Progress Notes (Signed)
 Cardiology Office Note:  .   Date:  11/07/2023  ID:  Kristopher Padilla, DOB 09/26/1940, MRN [id number] PCP: Roselind Congo, MD  [facility] Providers Cardiologist:  Knox Perl, MD   History of Present Illness: .   Kristopher Padilla is a 84 y.o. Caucasian male patient referred to me by [name], NP for evaluation and management of chest pain. Patient is presently undergoing routine physical therapy for balance and history of left medial tibial stress fracture, has hypertension, hypercholesterolemia started noticing chest tightness in the left side of the chest, triggers include emotional stress and physical activity and is relieved with rest.  No other associated symptoms of dyspnea, palpitations, dizziness or syncope.  He also complains of mild left lower extremity swelling and has been diagnosed with recent Bakers cyst and has been wearing support stockings regularly.  CT scan of the chest on 09/26/2017 had revealed aortic atherosclerosis and coronary artery calcification during follow-up of pulmonary nodules.  Discussed the use of AI scribe software for clinical note transcription with the patient, who gave verbal consent to proceed.  History of Present Illness The patient, with a history of hypertension, presents with intermittent chest pain that started a few weeks ago. The pain is described as a 'punch in the chest' and is located in the Left chest region. The pain is sharp in nature and lasts from a few seconds to a few minutes. The patient notes that the pain is not associated with physical activity, as he did not experience any chest pain while doing yard work.  In fact he had 1 episode of chest pain when he leaned forward in our office.   The patient also reports occasional episodes of feeling like he is going to pass out, with a sensation of a 'dark cloud' that lasts for a second and then goes away. The patient has a history of being physically active and currently takes medication for  hypertension.  There is no syncope, no other associated symptoms.  No neurologic deficits.  Labs   Lab Results  Component Value Date   NA 135 06/29/2023   K 4.3 06/29/2023   CO2 24 06/29/2023   GLUCOSE 188 (H) 06/29/2023   BUN 16 06/29/2023   CREATININE 1.23 06/29/2023   CALCIUM 9.7 06/29/2023   GFRNONAA 59 (L) 06/29/2023      Latest Ref Rng & Units 06/29/2023    4:56 AM 10/13/2019   12:44 PM 06/15/2013    8:00 AM  BMP  Glucose 70 - 99 mg/dL 409  811  914   BUN 8 - 23 mg/dL 16  21  19    Creatinine 0.61 - 1.24 mg/dL 7.82  9.56  2.13   Sodium 135 - 145 mmol/L 135  138  140   Potassium 3.5 - 5.1 mmol/L 4.3  4.4  4.2   Chloride 98 - 111 mmol/L 102  103  105   CO2 22 - 32 mmol/L 24  26  27    Calcium 8.9 - 10.3 mg/dL 9.7  9.4  9.3       Latest Ref Rng & Units 06/29/2023    4:56 AM 10/13/2019   12:44 PM 06/15/2013    8:00 AM  CBC  WBC 4.0 - 10.5 K/uL 12.5  7.6  12.3   Hemoglobin 13.0 - 17.0 g/dL 08.6  57.8  46.9   Hematocrit 39.0 - 52.0 % 43.6  45.0  44.2   Platelets 150 - 400 K/uL 220  213  194     External Labs:  PCP labs faxed over 10/13/2023:  Serum creatinine 1.20, EGFR 61 mL, BUN 19.  Sodium 138, potassium 4.2, LFTs normal.  Total cholesterol 104, triglycerides 82, HDL 38, LDL 49.  TSH 2.160, normal.  A1c 5.9%.  Review of Systems  Cardiovascular:  Positive for chest pain. Negative for dyspnea on exertion and leg swelling.  Neurological:  Positive for dizziness.   Physical Exam:   VS:  BP (!) 170/74 (BP Location: Left Arm, Patient Position: Sitting, Cuff Size: Normal)   Pulse 61   Resp 16   Ht 5\' 10"  (1.778 m)   Wt 212 lb (96.2 kg)   SpO2 97%   BMI 30.42 kg/m    Wt Readings from Last 3 Encounters:  11/07/23 212 lb (96.2 kg)  10/13/19 215 lb (97.5 kg)    Physical Exam Neck:     Vascular: No carotid bruit or JVD.  Cardiovascular:     Rate and Rhythm: Normal rate and regular rhythm.     Pulses: Intact distal pulses.     Heart sounds: Normal heart sounds.  No murmur heard.    No gallop.  Pulmonary:     Effort: Pulmonary effort is normal.     Breath sounds: Normal breath sounds.  Abdominal:     General: Bowel sounds are normal.     Palpations: Abdomen is soft.  Musculoskeletal:     Right lower leg: No edema.     Left lower leg: No edema.    Studies Reviewed: .    NA EKG:    EKG Interpretation Date/Time:  Monday November 07 2023 14:33:16 EDT Ventricular Rate:  61 PR Interval:  208 QRS Duration:  84 QT Interval:  372 QTC Calculation: 374 R Axis:   49  Text Interpretation: EKG 11/07/2023: Normal sinus rhythm at rate of 61 bpm, normal EKG.  Compared to 06/28/2024, no change. Confirmed by Laurier Jasperson, Jagadeesh ([id number]) on 11/07/2023 2:39:29 PM    Medications and allergies    No Known Allergies   Current Outpatient Medications:    ascorbic acid (VITAMIN C) 500 MG tablet, Take 250 mg by mouth daily., Disp: , Rfl:    Cyanocobalamin 1000 MCG TBCR, Take 1 tablet by mouth daily., Disp: , Rfl:    glucosamine-chondroitin 500-400 MG tablet, Take 1 tablet by mouth 3 (three) times daily., Disp: , Rfl:    lisinopril (ZESTRIL) 5 MG tablet, Take 5 mg by mouth daily., Disp: , Rfl:    Multiple Vitamins-Minerals (SENIOR MULTIVITAMIN PLUS PO), Take 1 Dose by mouth daily., Disp: , Rfl:    Red Yeast Rice Extract (RED YEAST RICE PO), Take 1 Dose by mouth daily., Disp: , Rfl:    sildenafil (VIAGRA) 100 MG tablet, Take 100 mg by mouth as needed for erectile dysfunction., Disp: , Rfl:    tamsulosin  (FLOMAX ) 0.4 MG CAPS capsule, Take 1 capsule (0.4 mg total) by mouth daily after breakfast., Disp: 10 capsule, Rfl: 0   Travoprost, BAK Free, (TRAVATAN) 0.004 % SOLN ophthalmic solution, Place 1 drop into both eyes at bedtime., Disp: , Rfl:    No orders of the defined types were placed in this encounter.    Medications Discontinued During This Encounter  Medication Reason   oxyCODONE -acetaminophen  (PERCOCET) 10-325 MG per tablet Completed Course   promethazine   (PHENERGAN ) 25 MG tablet Completed Course     ASSESSMENT AND PLAN: .      ICD-10-CM   1. Angina pectoris (HCC)  I20.9 EKG 12-Lead  2. Aortic atherosclerosis (HCC)  I70.0     3. Primary hypertension  I10     4. Hypercholesteremia  E78.00      Assessment & Plan Low heart rate with dizziness   He experiences intermittent dizziness two to three times weekly, possibly due to bradycardia. His history of physical activity in younger years may contribute to a lower heart rate. Current EKG shows no heart block or electrical conduction issues, though these remain in the differential diagnosis. Further evaluation with a treadmill stress test and heart monitor is planned to assess heart rate and rule out heart block. Order treadmill stress test and prescribe heart monitor, 2 weeks.  Musculoskeletal chest pain   He reports intermittent sharp chest pain lasting seconds to minutes, not linked to exertion or symptoms like dyspnea. The pain is reproducible with certain movements, indicating a musculoskeletal origin. EKG and physical exam are normal, with no signs of cardiac-related chest pain. Cholesterol levels are well-controlled with red yeast rice, and blood pressure is well-managed. Reassure that the chest pain is musculoskeletal and not cardiac in origin. He indeed has aortic atherosclerosis noted on remote CT scans however again his risk factors are well-controlled including lipids and hypertension.  He is presently 83 years of age and continues to remain very active.  Do not think we need to change course.  His physical examination is also normal.  I will see him back after the GXT and with monitoring in 6 to 8 weeks.  Signed,  Knox Perl, MD, [facility] 11/07/2023, 3:02 PM [facility] [address] [place], [place] [address] Phone: [phone]. Fax:  [phone]

## 2023-11-07 NOTE — Patient Instructions (Addendum)
 Medication Instructions:  Your physician recommends that you continue on your current medications as directed. Please refer to the Current Medication list given to you today.  *If you need a refill on your cardiac medications before your next appointment, please call your pharmacy*  Lab Work: none If you have labs (blood work) drawn today and your tests are completely normal, you will receive your results only by: MyChart Message (if you have MyChart) OR A paper copy in the mail If you have any lab test that is abnormal or we need to change your treatment, we will call you to review the results.  Testing/Procedures: Your physician has requested that you have an exercise tolerance test. For further information please visit [email or web address] Please also follow instruction sheet, as given.  Dr Berry Bristol recommends you wear  a zio monitor for 2 weeks.   Follow-Up: At [facility], you and your health needs are our priority.  As part of our continuing mission to provide you with exceptional heart care, our providers are all part of one team.  This team includes your primary Cardiologist (physician) and Advanced Practice Providers or APPs (Physician Assistants and Nurse Practitioners) who all work together to provide you with the care you need, when you need it.  Your next appointment:   6 - 8 week(s)  Provider:   Knox Perl, MD    We recommend signing up for the patient portal called "MyChart".  Sign up information is provided on this After Visit Summary.  MyChart is used to connect with patients for Virtual Visits (Telemedicine).  Patients are able to view lab/test results, encounter notes, upcoming appointments, etc.  Non-urgent messages can be sent to your provider as well.   To learn more about what you can do with MyChart, go to [email or web address].   Other Instructions ZIO XT- Long Term Monitor Instructions  Your physician has requested you wear a ZIO patch monitor for 14  days.  This is a single patch monitor. [employer] supplies one patch monitor per enrollment. Additional stickers are not available. Please do not apply patch if you will be having a Nuclear Stress Test,  Echocardiogram, Cardiac CT, MRI, or Chest Xray during the period you would be wearing the  monitor. The patch cannot be worn during these tests. You cannot remove and re-apply the  ZIO XT patch monitor.  Your ZIO patch monitor will be mailed 3 day USPS to your address on file. It may take 3-5 days  to receive your monitor after you have been enrolled.  Once you have received your monitor, please review the enclosed instructions. Your monitor  has already been registered assigning a specific monitor serial # to you.  Billing and Patient Assistance Program Information  We have supplied [employer] with any of your insurance information on file for billing purposes. [employer] offers a sliding scale Patient Assistance Program for patients that do not have  insurance, or whose insurance does not completely cover the cost of the ZIO monitor.  You must apply for the Patient Assistance Program to qualify for this discounted rate.  To apply, please call [employer] at [phone], select option 4, select option 2, ask to apply for  Patient Assistance Program. Sanna Crystal will ask your household income, and how many people  are in your household. They will quote your out-of-pocket cost based on that information.  [employer] will also be able to set up a 35-month, interest-free payment plan if needed.  Applying the monitor   Shave hair  from upper left chest.  Hold abrader disc by orange tab. Rub abrader in 40 strokes over the upper left chest as  indicated in your monitor instructions.  Clean area with 4 enclosed alcohol pads. Let dry.  Apply patch as indicated in monitor instructions. Patch will be placed under collarbone on left  side of chest with arrow pointing upward.  Rub patch adhesive wings for 2 minutes.  Remove white label marked "1". Remove the white  label marked "2". Rub patch adhesive wings for 2 additional minutes.  While looking in a mirror, press and release button in center of patch. A small green light will  flash 3-4 times. This will be your only indicator that the monitor has been turned on.  Do not shower for the first 24 hours. You may shower after the first 24 hours.  Press the button if you feel a symptom. You will hear a small click. Record Date, Time and  Symptom in the Patient Logbook.  When you are ready to remove the patch, follow instructions on the last 2 pages of Patient  Logbook. Stick patch monitor onto the last page of Patient Logbook.  Place Patient Logbook in the blue and white box. Use locking tab on box and tape box closed  securely. The blue and white box has prepaid postage on it. Please place it in the mailbox as  soon as possible. Your physician should have your test results approximately 7 days after the  monitor has been mailed back to [facility].  Call [facility] Customer Care at [phone] if you have questions regarding  your ZIO XT patch monitor. Call them immediately if you see an orange light blinking on your  monitor.  If your monitor falls off in less than 4 days, contact our Monitor department at [phone].  If your monitor becomes loose or falls off after 4 days call [employer] at [phone] for  suggestions on securing your monitor  Stress test instructions  Please arrive 15 minutes prior to your appointment time to allow for registration and insurance purposes.  The test will take approximately 45 minutes to complete.  How to prepare for your Exercise Stress Test: - Do bring a list of your current medications with you. You can take all your medications the day of the test. - DO wear comfortable clothes (no dresses or overalls) and walking shoes, tennis shoes preferred (no heels or open toed shoes allowed).

## 2023-11-07 NOTE — Progress Notes (Unsigned)
 Enrolled for [employer] to mail a ZIO XT long term holter monitor to the patients address on file.

## 2023-11-09 DIAGNOSIS — N400 Enlarged prostate without lower urinary tract symptoms: Secondary | ICD-10-CM | POA: Diagnosis not present

## 2023-11-09 DIAGNOSIS — N189 Chronic kidney disease, unspecified: Secondary | ICD-10-CM | POA: Diagnosis not present

## 2023-11-09 DIAGNOSIS — E782 Mixed hyperlipidemia: Secondary | ICD-10-CM | POA: Diagnosis not present

## 2023-11-14 DIAGNOSIS — M25552 Pain in left hip: Secondary | ICD-10-CM | POA: Diagnosis not present

## 2023-11-16 DIAGNOSIS — N200 Calculus of kidney: Secondary | ICD-10-CM | POA: Diagnosis not present

## 2023-11-16 DIAGNOSIS — R1084 Generalized abdominal pain: Secondary | ICD-10-CM | POA: Diagnosis not present

## 2023-11-16 DIAGNOSIS — N210 Calculus in bladder: Secondary | ICD-10-CM | POA: Diagnosis not present

## 2023-11-17 ENCOUNTER — Other Ambulatory Visit: Payer: Self-pay

## 2023-11-17 ENCOUNTER — Emergency Department (HOSPITAL_BASED_OUTPATIENT_CLINIC_OR_DEPARTMENT_OTHER)

## 2023-11-17 ENCOUNTER — Inpatient Hospital Stay (HOSPITAL_BASED_OUTPATIENT_CLINIC_OR_DEPARTMENT_OTHER)
Admission: EM | Admit: 2023-11-17 | Discharge: 2023-11-21 | DRG: 854 | Disposition: A | Discharge status: Home / Self Care | Attending: Internal Medicine | Admitting: Internal Medicine

## 2023-11-17 ENCOUNTER — Encounter (HOSPITAL_BASED_OUTPATIENT_CLINIC_OR_DEPARTMENT_OTHER): Payer: Self-pay | Admitting: Emergency Medicine

## 2023-11-17 DIAGNOSIS — K219 Gastro-esophageal reflux disease without esophagitis: Secondary | ICD-10-CM | POA: Diagnosis not present

## 2023-11-17 DIAGNOSIS — N1831 Chronic kidney disease, stage 3a: Secondary | ICD-10-CM | POA: Diagnosis present

## 2023-11-17 DIAGNOSIS — R109 Unspecified abdominal pain: Secondary | ICD-10-CM | POA: Diagnosis present

## 2023-11-17 DIAGNOSIS — Z79899 Other long term (current) drug therapy: Secondary | ICD-10-CM

## 2023-11-17 DIAGNOSIS — K5900 Constipation, unspecified: Secondary | ICD-10-CM | POA: Diagnosis not present

## 2023-11-17 DIAGNOSIS — K8020 Calculus of gallbladder without cholecystitis without obstruction: Secondary | ICD-10-CM | POA: Diagnosis not present

## 2023-11-17 DIAGNOSIS — K660 Peritoneal adhesions (postprocedural) (postinfection): Secondary | ICD-10-CM | POA: Diagnosis not present

## 2023-11-17 DIAGNOSIS — N1330 Unspecified hydronephrosis: Secondary | ICD-10-CM | POA: Diagnosis not present

## 2023-11-17 DIAGNOSIS — K810 Acute cholecystitis: Secondary | ICD-10-CM | POA: Diagnosis not present

## 2023-11-17 DIAGNOSIS — T464X5A Adverse effect of angiotensin-converting-enzyme inhibitors, initial encounter: Secondary | ICD-10-CM | POA: Diagnosis not present

## 2023-11-17 DIAGNOSIS — K828 Other specified diseases of gallbladder: Secondary | ICD-10-CM | POA: Diagnosis not present

## 2023-11-17 DIAGNOSIS — I700 Atherosclerosis of aorta: Secondary | ICD-10-CM | POA: Diagnosis not present

## 2023-11-17 DIAGNOSIS — E785 Hyperlipidemia, unspecified: Secondary | ICD-10-CM | POA: Diagnosis present

## 2023-11-17 DIAGNOSIS — Z8249 Family history of ischemic heart disease and other diseases of the circulatory system: Secondary | ICD-10-CM | POA: Diagnosis not present

## 2023-11-17 DIAGNOSIS — N179 Acute kidney failure, unspecified: Secondary | ICD-10-CM | POA: Diagnosis not present

## 2023-11-17 DIAGNOSIS — E66811 Obesity, class 1: Secondary | ICD-10-CM | POA: Diagnosis not present

## 2023-11-17 DIAGNOSIS — K8000 Calculus of gallbladder with acute cholecystitis without obstruction: Secondary | ICD-10-CM | POA: Diagnosis not present

## 2023-11-17 DIAGNOSIS — K82A1 Gangrene of gallbladder in cholecystitis: Secondary | ICD-10-CM | POA: Diagnosis not present

## 2023-11-17 DIAGNOSIS — R739 Hyperglycemia, unspecified: Secondary | ICD-10-CM

## 2023-11-17 DIAGNOSIS — I129 Hypertensive chronic kidney disease with stage 1 through stage 4 chronic kidney disease, or unspecified chronic kidney disease: Secondary | ICD-10-CM | POA: Diagnosis present

## 2023-11-17 DIAGNOSIS — Z01810 Encounter for preprocedural cardiovascular examination: Secondary | ICD-10-CM | POA: Diagnosis not present

## 2023-11-17 DIAGNOSIS — Z6829 Body mass index (BMI) 29.0-29.9, adult: Secondary | ICD-10-CM | POA: Diagnosis not present

## 2023-11-17 DIAGNOSIS — R001 Bradycardia, unspecified: Secondary | ICD-10-CM | POA: Diagnosis present

## 2023-11-17 DIAGNOSIS — A419 Sepsis, unspecified organism: Principal | ICD-10-CM | POA: Diagnosis present

## 2023-11-17 DIAGNOSIS — K838 Other specified diseases of biliary tract: Secondary | ICD-10-CM | POA: Diagnosis not present

## 2023-11-17 DIAGNOSIS — K819 Cholecystitis, unspecified: Secondary | ICD-10-CM | POA: Diagnosis not present

## 2023-11-17 DIAGNOSIS — Z87442 Personal history of urinary calculi: Secondary | ICD-10-CM

## 2023-11-17 DIAGNOSIS — I10 Essential (primary) hypertension: Secondary | ICD-10-CM

## 2023-11-17 DIAGNOSIS — K4020 Bilateral inguinal hernia, without obstruction or gangrene, not specified as recurrent: Secondary | ICD-10-CM | POA: Diagnosis present

## 2023-11-17 DIAGNOSIS — E7800 Pure hypercholesterolemia, unspecified: Secondary | ICD-10-CM | POA: Diagnosis not present

## 2023-11-17 LAB — COMPREHENSIVE METABOLIC PANEL WITH GFR
ALT: 16 U/L (ref 0–44)
AST: 23 U/L (ref 15–41)
Albumin: 4 g/dL (ref 3.5–5.0)
Alkaline Phosphatase: 51 U/L (ref 38–126)
Anion gap: 14 (ref 5–15)
BUN: 18 mg/dL (ref 8–23)
CO2: 23 mmol/L (ref 22–32)
Calcium: 9.7 mg/dL (ref 8.9–10.3)
Chloride: 98 mmol/L (ref 98–111)
Creatinine, Ser: 1.35 mg/dL — ABNORMAL HIGH (ref 0.61–1.24)
GFR, Estimated: 53 mL/min — ABNORMAL LOW (ref >60)
Glucose, Bld: 134 mg/dL — ABNORMAL HIGH (ref 70–99)
Potassium: 4.3 mmol/L (ref 3.5–5.1)
Sodium: 135 mmol/L (ref 135–145)
Total Bilirubin: 1.1 mg/dL (ref 0.0–1.2)
Total Protein: 7.2 g/dL (ref 6.5–8.1)

## 2023-11-17 LAB — CBC
HCT: 46.7 % (ref 39.0–52.0)
Hemoglobin: 16 g/dL (ref 13.0–17.0)
MCH: 30.4 pg (ref 26.0–34.0)
MCHC: 34.3 g/dL (ref 30.0–36.0)
MCV: 88.6 fL (ref 80.0–100.0)
Platelets: 215 10*3/uL (ref 150–400)
RBC: 5.27 MIL/uL (ref 4.22–5.81)
RDW: 12.3 % (ref 11.5–15.5)
WBC: 23.5 10*3/uL — ABNORMAL HIGH (ref 4.0–10.5)
nRBC: 0 % (ref 0.0–0.2)

## 2023-11-17 LAB — PROCALCITONIN: Procalcitonin: 0.34 ng/mL

## 2023-11-17 LAB — LIPASE, BLOOD: Lipase: 20 U/L (ref 11–51)

## 2023-11-17 LAB — HEMOGLOBIN A1C
Hgb A1c MFr Bld: 5.3 % (ref 4.8–5.6)
Mean Plasma Glucose: 105.41 mg/dL

## 2023-11-17 MED ORDER — LATANOPROST 0.005 % OP SOLN
1.0000 [drp] | Freq: Every day | OPHTHALMIC | Status: DC
  Administered 2023-11-17 – 2023-11-20 (×4): 1 [drp] via OPHTHALMIC
  Filled 2023-11-17: qty 2.5

## 2023-11-17 MED ORDER — FENTANYL CITRATE PF 50 MCG/ML IJ SOSY
50.0000 ug | PREFILLED_SYRINGE | Freq: Once | INTRAMUSCULAR | Status: AC
  Administered 2023-11-17: 50 ug via INTRAVENOUS
  Filled 2023-11-17: qty 1

## 2023-11-17 MED ORDER — FAMOTIDINE 20 MG PO TABS
20.0000 mg | ORAL_TABLET | Freq: Every day | ORAL | Status: DC
  Administered 2023-11-17 – 2023-11-20 (×4): 20 mg via ORAL
  Filled 2023-11-17 (×4): qty 1

## 2023-11-17 MED ORDER — ONDANSETRON HCL 4 MG PO TABS: 4.0000 mg | ORAL_TABLET | Freq: Four times a day (QID) | ORAL | Status: DC | PRN

## 2023-11-17 MED ORDER — TAMSULOSIN HCL 0.4 MG PO CAPS
0.4000 mg | ORAL_CAPSULE | Freq: Every day | ORAL | Status: DC
  Administered 2023-11-18 – 2023-11-21 (×4): 0.4 mg via ORAL
  Filled 2023-11-17 (×4): qty 1

## 2023-11-17 MED ORDER — ACETAMINOPHEN 325 MG PO TABS
650.0000 mg | ORAL_TABLET | Freq: Four times a day (QID) | ORAL | Status: DC | PRN
  Administered 2023-11-20 (×2): 650 mg via ORAL
  Filled 2023-11-17 (×2): qty 2

## 2023-11-17 MED ORDER — HYDROCODONE-ACETAMINOPHEN 5-325 MG PO TABS: 1.0000 | ORAL_TABLET | ORAL | Status: DC | PRN

## 2023-11-17 MED ORDER — HYDROMORPHONE HCL 1 MG/ML IJ SOLN: 0.5000 mg | INTRAMUSCULAR | Status: DC | PRN

## 2023-11-17 MED ORDER — SODIUM CHLORIDE 0.9 % IV BOLUS
1000.0000 mL | Freq: Once | INTRAVENOUS | Status: AC
  Administered 2023-11-17: 1000 mL via INTRAVENOUS

## 2023-11-17 MED ORDER — HYDRALAZINE HCL 20 MG/ML IJ SOLN
10.0000 mg | Freq: Four times a day (QID) | INTRAMUSCULAR | Status: DC | PRN
  Administered 2023-11-17: 10 mg via INTRAVENOUS
  Filled 2023-11-17: qty 1

## 2023-11-17 MED ORDER — PIPERACILLIN-TAZOBACTAM 3.375 G IVPB
3.3750 g | Freq: Three times a day (TID) | INTRAVENOUS | Status: DC
  Administered 2023-11-17 – 2023-11-21 (×11): 3.375 g via INTRAVENOUS
  Filled 2023-11-17 (×11): qty 50

## 2023-11-17 MED ORDER — HYDROMORPHONE HCL 1 MG/ML IJ SOLN
0.5000 mg | Freq: Once | INTRAMUSCULAR | Status: AC
  Administered 2023-11-17: 0.5 mg via INTRAVENOUS
  Filled 2023-11-17: qty 1

## 2023-11-17 MED ORDER — LACTATED RINGERS IV SOLN: INTRAVENOUS | Status: AC

## 2023-11-17 MED ORDER — ONDANSETRON HCL 4 MG/2ML IJ SOLN
4.0000 mg | Freq: Once | INTRAMUSCULAR | Status: AC
  Administered 2023-11-17: 4 mg via INTRAVENOUS
  Filled 2023-11-17: qty 2

## 2023-11-17 MED ORDER — ONDANSETRON HCL 4 MG/2ML IJ SOLN
4.0000 mg | Freq: Four times a day (QID) | INTRAMUSCULAR | Status: DC | PRN
  Administered 2023-11-17: 4 mg via INTRAVENOUS
  Filled 2023-11-17: qty 2

## 2023-11-17 MED ORDER — PIPERACILLIN-TAZOBACTAM 3.375 G IVPB 30 MIN
3.3750 g | Freq: Once | INTRAVENOUS | Status: AC
  Administered 2023-11-17: 3.375 g via INTRAVENOUS
  Filled 2023-11-17: qty 50

## 2023-11-17 MED ORDER — POLYETHYLENE GLYCOL 3350 17 G PO PACK
17.0000 g | PACK | Freq: Every day | ORAL | Status: DC
  Administered 2023-11-17: 17 g via ORAL
  Filled 2023-11-17 (×2): qty 1

## 2023-11-17 MED ORDER — SENNOSIDES-DOCUSATE SODIUM 8.6-50 MG PO TABS
1.0000 | ORAL_TABLET | Freq: Two times a day (BID) | ORAL | Status: DC
  Administered 2023-11-17: 1 via ORAL
  Filled 2023-11-17 (×2): qty 1

## 2023-11-17 MED ORDER — ACETAMINOPHEN 650 MG RE SUPP: 650.0000 mg | Freq: Four times a day (QID) | RECTAL | Status: DC | PRN

## 2023-11-17 MED ORDER — ALUM & MAG HYDROXIDE-SIMETH 200-200-20 MG/5ML PO SUSP
30.0000 mL | ORAL | Status: DC | PRN
  Administered 2023-11-17: 30 mL via ORAL
  Filled 2023-11-17: qty 30

## 2023-11-17 NOTE — Consult Note (Signed)
 Kristopher Padilla 1941/01/03  [id number].    Requesting MD: Tama Fails, PA-C Chief Complaint/Reason for Consult: Acute Cholecystitis   HPI: Kristopher Padilla is a 83 y.o. male who presented to [facility] ED for abdominal pain.  Patient reports approximately 4-5 days ago he developed severe epigastric and RUQ abdominal pain with associated nausea. He thought this may have been 2/2 kidney/ureteral stone as it felt similar to when he has had them in the past so he saw urology who obtained an outpatient CT renal stone study that showed pericholecystic stranding, punctate nonobstructing bilateral renal calculi without evidence of hydronephrosis, multiple bladder stones, no bowel obstruction and a normal appendix. He was directed to the ED for further workup.  In the ED he was afebrile, tachycardic to 108 and without hypotension. Tachycardia resolved.  WBC 23.5.  LFTs and lipase within normal limits.  RUQ US  showed the gallbladder is full of stones and sludge with mild gallbladder wall thickening to 5 mm.  No pericholecystic fluid.  CBD 3 mm.  He was started on Zosyn for suspected acute cholecystitis.   Patient was transferred to [facility] and admitted by [facility].  General surgery was asked to consult.  Patient states the pain started Monday night after eating [employer] nachos.  Past Medical History: HTN. HLD. Aortic atherosclerosis. He is being worked up for ?Angina Pectoris with low heart rate and dizziness by Dr. Berry Bristol of Cardiology who he saw on 11/07/23 and is scheduled for a future exercise tolerance test.   Prior Abdominal Surgeries: None Blood Thinners: None Allergies: NKDA Tobacco Use: None Alcohol Use: None  ROS: ROS As above, see hpi  History reviewed. No pertinent family history.  Past Medical History:  Diagnosis Date   Kidney stones     History reviewed. No pertinent surgical history.  Social History:  reports that he has never smoked. He does not have any smokeless tobacco history on file. He  reports that he does not drink alcohol. No history on file for drug use.  Allergies: No Known Allergies  (Not in a hospital admission)    Physical Exam: Blood pressure 114/63, pulse (!) 108, temperature 98.3 F (36.8 C), resp. rate 14, weight 92.5 kg, SpO2 95%.  General: pleasant, NAD HEENT: head is normocephalic, atraumatic.  Sclera are non-icteric. Ears and nose without any obvious masses or lesions.  Mouth is pink and moist. Dentition fair Heart: regular, rate, and rhythm.   Lungs: NWOB on room air Abd:  Soft, tender to palpation in RUQ, protuberant MS: no BUE or BLE edema Skin: warm and dry  Psych: A&Ox4 with an appropriate affect Neuro: normal speech, thought process intact, moves all extremities, gait not assessed   Results for orders placed or performed during the hospital encounter of 11/17/23 (from the past 48 hours)  Lipase, blood     Status: None   Collection Time: 11/17/23  9:46 AM  Result Value Ref Range   Lipase 20 11 - 51 U/L    Comment: Performed at [facility], [address] [place], [place] [address]  Comprehensive metabolic panel     Status: Abnormal   Collection Time: 11/17/23  9:46 AM  Result Value Ref Range   Sodium 135 135 - 145 mmol/L   Potassium 4.3 3.5 - 5.1 mmol/L    Comment: HEMOLYSIS AT THIS LEVEL MAY AFFECT RESULT   Chloride 98 98 - 111 mmol/L   CO2 23 22 - 32 mmol/L   Glucose, Bld  134 (H) 70 - 99 mg/dL    Comment: Glucose reference range applies only to samples taken after fasting for at least 8 hours.   BUN 18 8 - 23 mg/dL   Creatinine, Ser 8.65 (H) 0.61 - 1.24 mg/dL   Calcium 9.7 8.9 - 78.4 mg/dL   Total Protein 7.2 6.5 - 8.1 g/dL   Albumin 4.0 3.5 - 5.0 g/dL   AST 23 15 - 41 U/L    Comment: HEMOLYSIS AT THIS LEVEL MAY AFFECT RESULT   ALT 16 0 - 44 U/L   Alkaline Phosphatase 51 38 - 126 U/L   Total Bilirubin 1.1 0.0 - 1.2 mg/dL   GFR, Estimated 53 (L) >60 mL/min    Comment: (NOTE) Calculated using the CKD-EPI Creatinine  Equation (2021)    Anion gap 14 5 - 15    Comment: Performed at [facility], [address] [place], [place] [address]  CBC     Status: Abnormal   Collection Time: 11/17/23  9:46 AM  Result Value Ref Range   WBC 23.5 (H) 4.0 - 10.5 K/uL   RBC 5.27 4.22 - 5.81 MIL/uL   Hemoglobin 16.0 13.0 - 17.0 g/dL   HCT 52.8 41.3 - 24.4 %   MCV 88.6 80.0 - 100.0 fL   MCH 30.4 26.0 - 34.0 pg   MCHC 34.3 30.0 - 36.0 g/dL   RDW 01.0 27.2 - 53.6 %   Platelets 215 150 - 400 K/uL   nRBC 0.0 0.0 - 0.2 %    Comment: Performed at [facility], [address] [place], [place] [address]   US  ABDOMEN LIMITED RUQ (LIVER/GB) Result Date: 11/17/2023 CLINICAL DATA:  [id number] RUQ pain 151471 EXAM: ULTRASOUND ABDOMEN LIMITED RIGHT UPPER QUADRANT COMPARISON:  June 29, 2023 FINDINGS: Gallbladder: The gallbladder is full of stones and sludge. Mild gallbladder wall thickening measuring 5 mm. No sonographic Murphy's sign noted by sonographer. Common bile duct: Diameter: 3 mm Liver: Normal echogenicity. A few small cysts present measuring up to 2 cm. No intrahepatic biliary ductal dilation. Portal vein is patent on color Doppler imaging with normal direction of blood flow towards the liver. Other: Mild right-sided hydronephrosis. IMPRESSION: 1. Mild right-sided hydronephrosis. 2. The gallbladder is full of stones and biliary sludge. Mild gallbladder wall thickening along the hepatic surface of the gallbladder, measuring 5 mm. In the absence of a sonographic Murphy's sign and pericholecystic fluid, these changes may be related to acute hepatitis, upper abdominal inflammation, or volume overload, either from CHF or renal failure. Laboratory correlation recommended. Electronically Signed   By: Rance Burrows M.D.   On: 11/17/2023 12:09    Anti-infectives (From admission, onward)    Start     Dose/Rate Route Frequency Ordered Stop   11/17/23 2000  piperacillin-tazobactam (ZOSYN) IVPB 3.375 g        3.375  g 12.5 mL/hr over 240 Minutes Intravenous Every 8 hours 11/17/23 1233     11/17/23 1245  piperacillin-tazobactam (ZOSYN) IVPB 3.375 g        3.375 g 100 mL/hr over 30 Minutes Intravenous  Once 11/17/23 1233 11/17/23 1328       Assessment/Plan Acute Cholecystitis  This is a 83 year old male who presented with 4-5 days of severe epigastric/RUQ abdominal pain with associated nausea.  His outpatient CT scan showed evidence of pericholecystic stranding, no bowel obstruction, and a normal appendix.  In the ED his RUQ US  showed cholelithiasis with mild gallbladder wall thickening of  5 mm.  He is afebrile without hypotension and now in sinus rhythm after initial tachycardia this AM when presented to ED. WBC of 23.5.  His LFTs and lipase within normal limits.    Given patient is currently being worked up by cardiology for possible angina with a future stress test planned I think it would be best to have Cardiology see the patient for perioperative risk stratification and determine if he needs further testing before undergoing general anesthesia. Cont abx. If patient was to acutely worsen, would recommend Perc Chole.   If acceptable risk for surgery then would pursue laparoscopic cholecystectomy during this hospitalization. We discussed risks including infection, bleeding, abscess, wound complications, bile leak, injury to surrounding structures, retained stone, need for post-op ERCP.  If high surgical risk can pursue perc chole tube.  Discussed that this drain would stay in place for at least 6 weeks and the subsequent pathways that could follow including discussions of elective Cholecystectomy in the future once optimized from cardiac standpoint.   FEN - CLD, NPO at midnight VTE - Ok for VTE ppx from surgical standpoint ID - Zosyn   I reviewed ED provider notes, last 24 h vitals and pain scores, last 48 h intake and output, last 24 h labs and trends, and last 24 h imaging results.  This care  required moderate level of medical decision making.   Kristopher Jaffe, MD [facility] Surgery

## 2023-11-17 NOTE — Plan of Care (Signed)
   Problem: Education: Goal: Knowledge of General Education information will improve Description: Including pain rating scale, medication(s)/side effects and non-pharmacologic comfort measures Outcome: Progressing   Problem: Coping: Goal: Level of anxiety will decrease Outcome: Progressing

## 2023-11-17 NOTE — ED Notes (Signed)
 Patient transported to Ultrasound

## 2023-11-17 NOTE — ED Notes (Signed)
 Pt had a CT performed outpatient looking for renal calculus, he was told to go back to the ER because someone looked at it again and was concerned for gallbladder compromise. No further information, current abd pain ongoing.

## 2023-11-17 NOTE — ED Notes (Signed)
 Called [facility] for transport to [facility] @13 :48.  Spoke with Trenton Frock

## 2023-11-17 NOTE — ED Triage Notes (Signed)
 RUQ pain x 3 days , nausea yet no emesis . Last BM 3 days ago , distended abdomen . Had CT scan done yesterday , had recommendation to come to ED .

## 2023-11-17 NOTE — ED Provider Notes (Signed)
 [facility] Provider Note   CSN: [id number] Arrival date & time: 11/17/23  7829     History  Chief Complaint  Patient presents with   Abdominal Pain    ULICES SASSE is a 83 y.o. male who [facility] emergency department chief complaint of right upper quadrant abdominal pain.  This is his third episode of severe abdominal pain he has had.  He has a history of kidney stones and thought that this was likely the cause as it is similar with colicky severe pain and associated nausea.  Patient was previously seen and had a workup for kidney stones was told he had some stones in his kidneys and a few in his bladder and thought that it had likely just passed some stones because his pain had resolved.  He began having nausea and discomfort Sunday night and has had severe right upper quadrant pain since Monday for the past 4 days.  Had an outpatient renal stone study ordered by [facility] yesterday.  They did not find anything he was called today and told that they thought it might be his gallbladder and he should come in for further evaluation.  He denies changes in his urine or stool, he has nausea without vomiting.  He rates his pain as severe.  It is a little bit less severe than it was yesterday.  He has no history of the same.   Abdominal Pain      Home Medications Prior to Admission medications   Medication Sig Start Date End Date Taking? Authorizing Provider  ascorbic acid (VITAMIN C) 500 MG tablet Take 250 mg by mouth daily. 02/20/20   [provider]  Cyanocobalamin 1000 MCG TBCR Take 1 tablet by mouth daily. 02/20/20   [provider]  glucosamine-chondroitin 500-400 MG tablet Take 1 tablet by mouth 3 (three) times daily.    [provider]  lisinopril (ZESTRIL) 5 MG tablet Take 5 mg by mouth daily. 10/07/23   [provider]  Multiple Vitamins-Minerals (SENIOR MULTIVITAMIN PLUS PO) Take 1 Dose by mouth daily.     [provider]  Red Yeast Rice Extract (RED YEAST RICE PO) Take 1 Dose by mouth daily.    [provider]  sildenafil (VIAGRA) 100 MG tablet Take 100 mg by mouth as needed for erectile dysfunction. 02/20/20   [provider]  tamsulosin  (FLOMAX ) 0.4 MG CAPS capsule Take 1 capsule (0.4 mg total) by mouth daily after breakfast. 06/15/13   Marlei Glomski, PA-C  Travoprost, BAK Free, (TRAVATAN) 0.004 % SOLN ophthalmic solution Place 1 drop into both eyes at bedtime.    [provider]      Allergies    Patient has no known allergies.    Review of Systems   Review of Systems  Gastrointestinal:  Positive for abdominal pain.    Physical Exam Updated Vital Signs BP 114/63 (BP Location: Right Arm)   Pulse (!) 108   Temp 98.3 F (36.8 C)   Resp 14   Wt 92.5 kg   SpO2 95%   BMI 29.27 kg/m  Physical Exam Vitals and nursing note reviewed.  Constitutional:      General: He is not in acute distress.    Appearance: He is well-developed. He is not diaphoretic.  HENT:     Head: Normocephalic and atraumatic.  Eyes:     General: No scleral icterus.    Conjunctiva/sclera: Conjunctivae normal.  Cardiovascular:  Rate and Rhythm: Normal rate and regular rhythm.     Heart sounds: Normal heart sounds.  Pulmonary:     Effort: Pulmonary effort is normal. No respiratory distress.     Breath sounds: Normal breath sounds.  Abdominal:     Palpations: Abdomen is soft.     Tenderness: There is abdominal tenderness in the right upper quadrant and epigastric area.  Musculoskeletal:     Cervical back: Normal range of motion and neck supple.  Skin:    General: Skin is warm and dry.  Neurological:     Mental Status: He is alert.  Psychiatric:        Behavior: Behavior normal.     ED Results / Procedures / Treatments   Labs (all labs ordered are listed, but only abnormal results are displayed) Labs Reviewed  LIPASE, BLOOD  COMPREHENSIVE METABOLIC PANEL  WITH GFR  CBC  URINALYSIS, ROUTINE W REFLEX MICROSCOPIC    EKG None  Radiology No results found.  Procedures Procedures    Medications Ordered in ED Medications  sodium chloride  0.9 % bolus 1,000 mL (has no administration in time range)  fentaNYL  (SUBLIMAZE ) injection 50 mcg (has no administration in time range)  ondansetron  (ZOFRAN ) injection 4 mg (has no administration in time range)    ED Course/ Medical Decision Making/ A&P Clinical Course as of 11/19/23 1946  Thu Nov 17, 2023  1105 WBC(!): 23.5 [AH]  1146 WBC(!): 23.5 [AH]  1233 And had a sip of water and took Dulcolax this morning and 8 AM he has had no solid food since Tuesday of this week [AH]  1250 Creatinine(!): 1.35 [AH]  1251 US  ABDOMEN LIMITED RUQ (LIVER/GB) Visualized and interpreted right upper quadrant ultrasound.  He appears to have gallbladder wall thickening sludge and multiple gallstones. Case discussed with PA Alferd Igo surgery team.  He asked that we admit to medicine for elevated white blood cell count and tachycardia and also have them contact surgery when he arrives. [AH]    Clinical Course User Index [AH] Tama Fails, PA-C                                 Medical Decision Making Amount and/or Complexity of Data Reviewed Labs: ordered. Decision-making details documented in ED Course. Radiology: ordered. Decision-making details documented in ED Course.  Risk Prescription drug management. Decision regarding hospitalization.   This patient presents to the ED for concern of RUQ pain, this involves an extensive number of treatment options, and is a complaint that carries with it a high risk of complications and morbidity.  The differential diagnosis for RUQ is, but not limited to:  Cholelithiasis, cholecystitis, cholangitis, choledocholithiasis, hepatitis, pancreatitis, RLL pneumonia, pyelonephritis, urinary calculi, abdominal/liver abscess, musculoskeletal pain, herpes zoster.   Co  morbidities:    has a past medical history of Kidney stones.   Social Determinants of Health:   SDOH Screenings   Food Insecurity: No Food Insecurity (11/18/2023)  Housing: Low Risk  (11/18/2023)  Transportation Needs: No Transportation Needs (11/18/2023)  Utilities: Not At Risk (11/18/2023)  Social Connections: Patient Declined (11/17/2023)  Tobacco Use: Unknown (11/18/2023)     Additional history:  {Additional history obtained from patient's wife   Lab Tests:  I Ordered, and personally interpreted labs.  The pertinent results include:   Wbc 23.5 LFTS// lipase wnl  Slignt bump in Cr at 1.35  Imaging Studies:  I ordered imaging studies including RUQ  US   I independently visualized and interpreted imaging which showed GB stones, sludge, wall thickening I agree with the radiologist interpretation  Cardiac Monitoring/ECG:  The patient was maintained on a cardiac monitor.  I personally viewed and interpreted the cardiac monitored which showed an underlying rhythm of:    Medicines ordered and prescription drug management:  I ordered medication including  Medications  sodium chloride  0.9 % bolus 1,000 mL (has no administration in time range)  fentaNYL  (SUBLIMAZE ) injection 50 mcg (has no administration in time range)  ondansetron  (ZOFRAN ) injection 4 mg (has no administration in time range)  Zosyn  3.75 MG Hydromorphone  0,5 mg     for acute cholecystitis Reevaluation of the patient after these medicines showed that the patient improved I have reviewed the patients home medicines and have made adjustments as needed  Test Considered:  CT abd - Us  sufficient  Critical Interventions:  fluids, pain meds, abx  Consultations Obtained: Surgery per ed course, Dr. Lucious Sabina for admission  Problem List / ED Course:     ICD-10-CM   1. Acute cholecystitis due to biliary calculus  K80.00       MDM: Patient here with acute cholecystitis    Dispostion:  After  consideration of the diagnostic results and the patients response to treatment, I feel that the patent would benefit from admission.         Final Clinical Impression(s) / ED Diagnoses Final diagnoses:  None    Rx / DC Orders ED Discharge Orders     None         Tama Fails, PA-C 11/19/23 2013    Lowery Rue, DO 11/22/23 2339

## 2023-11-17 NOTE — H&P (Addendum)
 History and Physical  Patient: Kristopher Padilla:[id number] DOB: [birth year] DOA: 11/17/2023 DOS: the patient was seen and examined on 11/17/2023 Patient coming from: Home  Chief Complaint:  Chief Complaint  Patient presents with   Abdominal Pain   HPI: Kristopher Padilla is a 83 y.o. male with PMH significant of hypertension, hyperlipidemia, aortic atherosclerosis presented to ED with abdominal pain.  Patient reported that he started having severe epigastric and right upper quadrant abdominal pain with nausea since Saturday 5 days ago.  No vomiting.  Described abdominal pain as as a sharp, 8/10, intermittent, poor appetite and has not had much to eat in the last 2 to 3 days.  He initially thought he may have kidney stones, followed up with urology and underwent outpatient CT renal stone study which showed pericholecystic stranding, punctate nonobstructing bilateral renal calculi without hydronephrosis, multiple bladder stones, no SBO and normal appendix.  Patient was referred to ED. Patient also reported constipation for last 3 days with bloating. Patient also reported that he saw Dr. Berry Bristol of cardiology on 11/07/2023 for dizziness and  "jabs in his chest", had event monitor, was scheduled for a future exercise stress test.  ED course:  In ED, temp 98.3 F, heart rate 108, BP 114/63, O2 sats 95% on room air Labs showed sodium 135, BUN 18, creatinine 1.35.  Creatinine was 1.21 06/29/2023 WBCs 23.5 hemoglobin 16.0, glucose 134   Review of Systems: As mentioned in the history of present illness. All other systems reviewed and are negative.   Past Medical History:  Diagnosis Date   Kidney stones        Essential hypertension      Hyperlipidemia      Aortic atherosclerosis  Past surgical history Cataract surgery Umbilical hernia surgery Bilateral inguinal hernia surgery Achilles tendon repair    Social History:  reports that he has never smoked. He does not have any smokeless tobacco history  on file. He reports that he does not drink alcohol. No history on file for drug use.  No Known Allergies  History reviewed. No pertinent family history.   Prior to Admission medications   Medication Sig Start Date End Date Taking? Authorizing Provider  ascorbic acid (VITAMIN C) 500 MG tablet Take 250 mg by mouth daily. 02/20/20  Yes [provider]  Cyanocobalamin 1000 MCG TBCR Take 1 tablet by mouth daily. 02/20/20  Yes [provider]  glucosamine-chondroitin 500-400 MG tablet Take 1 tablet by mouth 3 (three) times daily.   Yes [provider]  lisinopril (ZESTRIL) 5 MG tablet Take 5 mg by mouth daily. 10/07/23  Yes [provider]  Multiple Vitamins-Minerals (SENIOR MULTIVITAMIN PLUS PO) Take 1 Dose by mouth daily.   Yes [provider]  Red Yeast Rice Extract (RED YEAST RICE PO) Take 1 Dose by mouth daily.   Yes [provider]  sildenafil (VIAGRA) 100 MG tablet Take 100 mg by mouth as needed for erectile dysfunction. 02/20/20  Yes [provider]  tamsulosin  (FLOMAX ) 0.4 MG CAPS capsule Take 1 capsule (0.4 mg total) by mouth daily after breakfast. 06/15/13  Yes Harris, Abigail, PA-C  Travoprost, BAK Free, (TRAVATAN) 0.004 % SOLN ophthalmic solution Place 1 drop into both eyes at bedtime.   Yes [provider]   Physical Exam: Vitals:   11/17/23 1415 11/17/23 1430 11/17/23 1433 11/17/23 1629  BP: (!) 141/67   (!) 156/63  Pulse: 78 78  74  Resp: 17   18  Temp:  99.1 F (37.3 C) 98.9 F (37.2 C)  TempSrc:   Oral Oral  SpO2: 95% 94%  97%  Weight:         General: Alert, awake, oriented x3, NAD Eyes: pink conjunctiva, anicteric sclera, PERLA HEENT: normocephalic, atraumatic, oropharynx clear Neck: supple, no masses or lymphadenopathy, no JVD CVS: Regular rate and rhythm, no murmurs, rubs or gallops. Resp : Clear to auscultation bilaterally, no wheezing, rales or rhonchi. GI : Soft, mildly distended, RUQ TTP,  NBS,   Ext: No lower extremity edema  Musculoskeletal: No clubbing or cyanosis, positive pedal pulses. No contracture. ROM intact  Neuro: Grossly intact, no focal neurological deficits, strength 5/5 upper and lower extremities bilaterally Psych: alert and oriented x 3, normal mood and affect Skin: no rashes or lesions, warm and dry   Data Reviewed: I have reviewed ED notes, Vitals, Lab results and outpatient records.   Recent Labs  Lab 11/17/23 0946  NA 135  K 4.3  CL 98  CO2 23  GLUCOSE 134*  BUN 18  CREATININE 1.35*  CALCIUM 9.7   Recent Labs  Lab 11/17/23 0946  WBC 23.5*  HGB 16.0  HCT 46.7  MCV 88.6  PLT 215    Assessment and Plan Principal Problem: Sepsis with acute cholecystitis -Patient met sepsis criteria on admission with tachycardia, borderline BP, leukocytosis, mild AKI, source likely due to acute cholecystitis -Outpatient CT with pericholecystic stranding, no bowel obstruction and normal appendix. - RUQ ultrasound showed cholelithiasis with mild gallbladder wall thickening of 5 mm - Patient has been seen by general surgery, placed on clear liquid diet and n.p.o. after midnight - Per surgery, requested cardiology evaluation for preop testing  - Obtain blood cultures, procalcitonin, placed on IV Zosyn - Continue IV fluids, pain control, antiemetics as needed  Active Problems:  ?  Angina equivalent - Per patient he has been having jabs in his chest intermittently although no frank chest pain, shortness of breath, DOE.   - Will consult cardiology for further workup and preop evaluation - Troponin is negative EKG with normal sinus rhythm, no acute ST-T wave changes    Essential hypertension -Will place on IV hydralazine as needed with parameters. -Hold lisinopril due to mild AKI  Acute renal sufficiency -Creatinine 1.35 on admission, creatinine was 1.2 in 06/2023 likely due to poor p.o. intake, lisinopril  - Placed on IV fluid hydration     Hyperglycemia  -Noted glucose 134 on presentation and was 188 on CMP in 06/2023 - Obtain hemoglobin A1c  Constipation - Placed on bowel regimen with MiraLAX and Senokot-S   Advance Care Planning:   Code Status: Full Code discussed with the patient Consults: Seen by surgery, message sent to cardiology Family Communication: Daughter at the bedside Severity of Illness:      The appropriate patient status for this patient is OBSERVATION. Observation status is judged to be reasonable and necessary in order to provide the required intensity of service to ensure the patient's safety. The patient's presenting symptoms, physical exam findings, and initial radiographic and laboratory data in the context of their medical condition is felt to place them at decreased risk for further clinical deterioration. Furthermore, it is anticipated that the patient will be medically stable for discharge from the hospital within 2 midnights of admission.     Author: Bertram Brocks, MD 11/17/2023 6:02 PM For on call review [email or web address].

## 2023-11-18 ENCOUNTER — Inpatient Hospital Stay (HOSPITAL_COMMUNITY): Admitting: Anesthesiology

## 2023-11-18 ENCOUNTER — Encounter (HOSPITAL_COMMUNITY)
Admission: EM | Disposition: A | Discharge status: Home / Self Care | Payer: Self-pay | Source: Home / Self Care | Attending: Internal Medicine

## 2023-11-18 ENCOUNTER — Encounter (HOSPITAL_COMMUNITY): Payer: Self-pay | Admitting: Internal Medicine

## 2023-11-18 ENCOUNTER — Observation Stay (HOSPITAL_BASED_OUTPATIENT_CLINIC_OR_DEPARTMENT_OTHER)

## 2023-11-18 DIAGNOSIS — K82A1 Gangrene of gallbladder in cholecystitis: Secondary | ICD-10-CM | POA: Diagnosis present

## 2023-11-18 DIAGNOSIS — Z8249 Family history of ischemic heart disease and other diseases of the circulatory system: Secondary | ICD-10-CM | POA: Diagnosis not present

## 2023-11-18 DIAGNOSIS — N1831 Chronic kidney disease, stage 3a: Secondary | ICD-10-CM | POA: Diagnosis present

## 2023-11-18 DIAGNOSIS — E7800 Pure hypercholesterolemia, unspecified: Secondary | ICD-10-CM | POA: Diagnosis not present

## 2023-11-18 DIAGNOSIS — Z01810 Encounter for preprocedural cardiovascular examination: Secondary | ICD-10-CM

## 2023-11-18 DIAGNOSIS — E66811 Obesity, class 1: Secondary | ICD-10-CM | POA: Diagnosis present

## 2023-11-18 DIAGNOSIS — Z79899 Other long term (current) drug therapy: Secondary | ICD-10-CM | POA: Diagnosis not present

## 2023-11-18 DIAGNOSIS — K8000 Calculus of gallbladder with acute cholecystitis without obstruction: Secondary | ICD-10-CM | POA: Diagnosis present

## 2023-11-18 DIAGNOSIS — R739 Hyperglycemia, unspecified: Secondary | ICD-10-CM | POA: Diagnosis present

## 2023-11-18 DIAGNOSIS — A419 Sepsis, unspecified organism: Secondary | ICD-10-CM | POA: Diagnosis present

## 2023-11-18 DIAGNOSIS — K5900 Constipation, unspecified: Secondary | ICD-10-CM | POA: Diagnosis present

## 2023-11-18 DIAGNOSIS — I700 Atherosclerosis of aorta: Secondary | ICD-10-CM | POA: Diagnosis present

## 2023-11-18 DIAGNOSIS — K810 Acute cholecystitis: Secondary | ICD-10-CM

## 2023-11-18 DIAGNOSIS — N179 Acute kidney failure, unspecified: Secondary | ICD-10-CM | POA: Diagnosis present

## 2023-11-18 DIAGNOSIS — R001 Bradycardia, unspecified: Secondary | ICD-10-CM | POA: Diagnosis present

## 2023-11-18 DIAGNOSIS — K660 Peritoneal adhesions (postprocedural) (postinfection): Secondary | ICD-10-CM | POA: Diagnosis present

## 2023-11-18 DIAGNOSIS — T464X5A Adverse effect of angiotensin-converting-enzyme inhibitors, initial encounter: Secondary | ICD-10-CM | POA: Diagnosis present

## 2023-11-18 DIAGNOSIS — K4020 Bilateral inguinal hernia, without obstruction or gangrene, not specified as recurrent: Secondary | ICD-10-CM | POA: Diagnosis present

## 2023-11-18 DIAGNOSIS — I10 Essential (primary) hypertension: Secondary | ICD-10-CM

## 2023-11-18 DIAGNOSIS — N1330 Unspecified hydronephrosis: Secondary | ICD-10-CM | POA: Diagnosis present

## 2023-11-18 DIAGNOSIS — R109 Unspecified abdominal pain: Secondary | ICD-10-CM | POA: Diagnosis present

## 2023-11-18 DIAGNOSIS — K819 Cholecystitis, unspecified: Secondary | ICD-10-CM | POA: Diagnosis not present

## 2023-11-18 DIAGNOSIS — I129 Hypertensive chronic kidney disease with stage 1 through stage 4 chronic kidney disease, or unspecified chronic kidney disease: Secondary | ICD-10-CM | POA: Diagnosis present

## 2023-11-18 DIAGNOSIS — K219 Gastro-esophageal reflux disease without esophagitis: Secondary | ICD-10-CM | POA: Diagnosis present

## 2023-11-18 DIAGNOSIS — E785 Hyperlipidemia, unspecified: Secondary | ICD-10-CM

## 2023-11-18 DIAGNOSIS — Z87442 Personal history of urinary calculi: Secondary | ICD-10-CM | POA: Diagnosis not present

## 2023-11-18 DIAGNOSIS — Z6829 Body mass index (BMI) 29.0-29.9, adult: Secondary | ICD-10-CM | POA: Diagnosis not present

## 2023-11-18 HISTORY — PX: CHOLECYSTECTOMY: SHX55

## 2023-11-18 LAB — COMPREHENSIVE METABOLIC PANEL WITH GFR
ALT: 19 U/L (ref 0–44)
AST: 20 U/L (ref 15–41)
Albumin: 3.4 g/dL — ABNORMAL LOW (ref 3.5–5.0)
Alkaline Phosphatase: 43 U/L (ref 38–126)
Anion gap: 11 (ref 5–15)
BUN: 23 mg/dL (ref 8–23)
CO2: 24 mmol/L (ref 22–32)
Calcium: 9.1 mg/dL (ref 8.9–10.3)
Chloride: 99 mmol/L (ref 98–111)
Creatinine, Ser: 1.25 mg/dL — ABNORMAL HIGH (ref 0.61–1.24)
GFR, Estimated: 58 mL/min — ABNORMAL LOW (ref >60)
Glucose, Bld: 139 mg/dL — ABNORMAL HIGH (ref 70–99)
Potassium: 4 mmol/L (ref 3.5–5.1)
Sodium: 134 mmol/L — ABNORMAL LOW (ref 135–145)
Total Bilirubin: 1.3 mg/dL — ABNORMAL HIGH (ref 0.0–1.2)
Total Protein: 6.7 g/dL (ref 6.5–8.1)

## 2023-11-18 LAB — ECHOCARDIOGRAM LIMITED
AR max vel: 3.16 cm2
AV Area VTI: 3.45 cm2
AV Area mean vel: 2.89 cm2
AV Mean grad: 5 mmHg
AV Peak grad: 8.9 mmHg
Ao pk vel: 1.49 m/s
Calc EF: 72.2 %
Height: 70 in
Single Plane A2C EF: 77.1 %
Single Plane A4C EF: 66.6 %
Weight: 3264.01 [oz_av]

## 2023-11-18 LAB — CBC
HCT: 45.7 % (ref 39.0–52.0)
Hemoglobin: 14.8 g/dL (ref 13.0–17.0)
MCH: 30.5 pg (ref 26.0–34.0)
MCHC: 32.4 g/dL (ref 30.0–36.0)
MCV: 94 fL (ref 80.0–100.0)
Platelets: 229 10*3/uL (ref 150–400)
RBC: 4.86 MIL/uL (ref 4.22–5.81)
RDW: 12.3 % (ref 11.5–15.5)
WBC: 23.3 10*3/uL — ABNORMAL HIGH (ref 4.0–10.5)
nRBC: 0 % (ref 0.0–0.2)

## 2023-11-18 SURGERY — LAPAROSCOPIC CHOLECYSTECTOMY WITH INTRAOPERATIVE CHOLANGIOGRAM
Anesthesia: General

## 2023-11-18 MED ORDER — DEXMEDETOMIDINE HCL IN NACL 80 MCG/20ML IV SOLN
INTRAVENOUS | Status: DC | PRN
  Administered 2023-11-18: 8 ug via INTRAVENOUS

## 2023-11-18 MED ORDER — PROPOFOL 1000 MG/100ML IV EMUL
INTRAVENOUS | Status: AC
  Filled 2023-11-18: qty 300

## 2023-11-18 MED ORDER — SUGAMMADEX SODIUM 200 MG/2ML IV SOLN
INTRAVENOUS | Status: DC | PRN
  Administered 2023-11-18: 200 mg via INTRAVENOUS

## 2023-11-18 MED ORDER — PROPOFOL 10 MG/ML IV BOLUS
INTRAVENOUS | Status: DC | PRN
  Administered 2023-11-18: 120 mg via INTRAVENOUS

## 2023-11-18 MED ORDER — DEXAMETHASONE SODIUM PHOSPHATE 10 MG/ML IJ SOLN
INTRAMUSCULAR | Status: DC | PRN
  Administered 2023-11-18: 10 mg via INTRAVENOUS

## 2023-11-18 MED ORDER — PROPOFOL 500 MG/50ML IV EMUL
INTRAVENOUS | Status: AC
  Filled 2023-11-18: qty 150

## 2023-11-18 MED ORDER — FENTANYL CITRATE (PF) 100 MCG/2ML IJ SOLN
INTRAMUSCULAR | Status: AC
Start: 2023-11-18 — End: ?
  Filled 2023-11-18: qty 2

## 2023-11-18 MED ORDER — FENTANYL CITRATE (PF) 100 MCG/2ML IJ SOLN
INTRAMUSCULAR | Status: AC
  Filled 2023-11-18: qty 2

## 2023-11-18 MED ORDER — BUPIVACAINE-EPINEPHRINE 0.25% -1:200000 IJ SOLN
INTRAMUSCULAR | Status: DC | PRN
  Administered 2023-11-18: 20 mL

## 2023-11-18 MED ORDER — POLYETHYLENE GLYCOL 3350 17 G PO PACK
17.0000 g | PACK | Freq: Every day | ORAL | Status: DC | PRN
  Administered 2023-11-20: 17 g via ORAL
  Filled 2023-11-18 (×2): qty 1

## 2023-11-18 MED ORDER — LIDOCAINE HCL (PF) 2 % IJ SOLN
INTRAMUSCULAR | Status: AC
  Filled 2023-11-18: qty 5

## 2023-11-18 MED ORDER — CHLORHEXIDINE GLUCONATE 0.12 % MT SOLN
15.0000 mL | Freq: Once | OROMUCOSAL | Status: AC
  Administered 2023-11-18: 15 mL via OROMUCOSAL

## 2023-11-18 MED ORDER — 0.9 % SODIUM CHLORIDE (POUR BTL) OPTIME
TOPICAL | Status: DC | PRN
  Administered 2023-11-18: 1000 mL

## 2023-11-18 MED ORDER — 0.9 % SODIUM CHLORIDE (POUR BTL) OPTIME: TOPICAL | Status: DC | PRN

## 2023-11-18 MED ORDER — PHENYLEPHRINE 80 MCG/ML (10ML) SYRINGE FOR IV PUSH (FOR BLOOD PRESSURE SUPPORT)
PREFILLED_SYRINGE | INTRAVENOUS | Status: DC | PRN
Start: 2023-11-18 — End: 2023-11-18
  Administered 2023-11-18: 160 ug via INTRAVENOUS
  Administered 2023-11-18: 80 ug via INTRAVENOUS
  Administered 2023-11-18: 160 ug via INTRAVENOUS

## 2023-11-18 MED ORDER — HYDROMORPHONE HCL 1 MG/ML IJ SOLN: 1.0000 mg | INTRAMUSCULAR | Status: DC | PRN

## 2023-11-18 MED ORDER — LACTATED RINGERS IV SOLN: INTRAVENOUS | Status: DC

## 2023-11-18 MED ORDER — LACTATED RINGERS IR SOLN
Status: DC | PRN
Start: 2023-11-18 — End: 2023-11-18
  Administered 2023-11-18 (×2): 1000 mL

## 2023-11-18 MED ORDER — PROPOFOL 10 MG/ML IV BOLUS
INTRAVENOUS | Status: AC
  Filled 2023-11-18: qty 20

## 2023-11-18 MED ORDER — OXYCODONE HCL 5 MG PO TABS: 5.0000 mg | ORAL_TABLET | Freq: Once | ORAL | Status: DC | PRN

## 2023-11-18 MED ORDER — DROPERIDOL 2.5 MG/ML IJ SOLN: 0.6250 mg | Freq: Once | INTRAMUSCULAR | Status: DC | PRN

## 2023-11-18 MED ORDER — ROCURONIUM BROMIDE 10 MG/ML (PF) SYRINGE
PREFILLED_SYRINGE | INTRAVENOUS | Status: AC
  Filled 2023-11-18: qty 20

## 2023-11-18 MED ORDER — OXYCODONE HCL 5 MG PO TABS
5.0000 mg | ORAL_TABLET | ORAL | Status: DC | PRN
  Administered 2023-11-18 – 2023-11-20 (×5): 5 mg via ORAL
  Filled 2023-11-18: qty 1
  Filled 2023-11-18: qty 2
  Filled 2023-11-18 (×4): qty 1

## 2023-11-18 MED ORDER — LIDOCAINE 2% (20 MG/ML) 5 ML SYRINGE
INTRAMUSCULAR | Status: DC | PRN
Start: 2023-11-18 — End: 2023-11-18
  Administered 2023-11-18: 80 mg via INTRAVENOUS

## 2023-11-18 MED ORDER — METOPROLOL TARTRATE 5 MG/5ML IV SOLN
INTRAVENOUS | Status: AC
  Filled 2023-11-18: qty 5

## 2023-11-18 MED ORDER — ONDANSETRON HCL 4 MG/2ML IJ SOLN
INTRAMUSCULAR | Status: AC
  Filled 2023-11-18: qty 4

## 2023-11-18 MED ORDER — OXYCODONE HCL 5 MG/5ML PO SOLN: 5.0000 mg | Freq: Once | ORAL | Status: DC | PRN

## 2023-11-18 MED ORDER — FENTANYL CITRATE PF 50 MCG/ML IJ SOSY: 25.0000 ug | PREFILLED_SYRINGE | INTRAMUSCULAR | Status: DC | PRN

## 2023-11-18 MED ORDER — DEXAMETHASONE SODIUM PHOSPHATE 10 MG/ML IJ SOLN
INTRAMUSCULAR | Status: AC
  Filled 2023-11-18: qty 2

## 2023-11-18 MED ORDER — INDOCYANINE GREEN 25 MG IV SOLR
1.2500 mg | INTRAVENOUS | Status: AC
  Administered 2023-11-18: 1.25 mg via INTRAVENOUS

## 2023-11-18 MED ORDER — FENTANYL CITRATE (PF) 250 MCG/5ML IJ SOLN
INTRAMUSCULAR | Status: DC | PRN
  Administered 2023-11-18: 50 ug via INTRAVENOUS
  Administered 2023-11-18: 25 ug via INTRAVENOUS
  Administered 2023-11-18: 50 ug via INTRAVENOUS
  Administered 2023-11-18 (×3): 25 ug via INTRAVENOUS

## 2023-11-18 MED ORDER — ONDANSETRON HCL 4 MG/2ML IJ SOLN
INTRAMUSCULAR | Status: DC | PRN
  Administered 2023-11-18: 4 mg via INTRAVENOUS

## 2023-11-18 MED ORDER — ROCURONIUM BROMIDE 10 MG/ML (PF) SYRINGE
PREFILLED_SYRINGE | INTRAVENOUS | Status: DC | PRN
  Administered 2023-11-18: 60 mg via INTRAVENOUS
  Administered 2023-11-18: 10 mg via INTRAVENOUS

## 2023-11-18 SURGICAL SUPPLY — 40 items
BAG COUNTER SPONGE SURGICOUNT (BAG) ×1 IMPLANT
CHLORAPREP W/TINT 26 (MISCELLANEOUS) ×1 IMPLANT
CLIP APPLIE 5 13 M/L LIGAMAX5 (MISCELLANEOUS) IMPLANT
CLIP LIGATING HEMO O LOK GREEN (MISCELLANEOUS) ×1 IMPLANT
COVER MAYO STAND XLG (MISCELLANEOUS) IMPLANT
COVER SURGICAL LIGHT HANDLE (MISCELLANEOUS) ×1 IMPLANT
DERMABOND ADVANCED .7 DNX12 (GAUZE/BANDAGES/DRESSINGS) ×1 IMPLANT
DRAIN CHANNEL 19F RND (DRAIN) IMPLANT
DRAPE C-ARM 42X120 X-RAY (DRAPES) IMPLANT
DRSG TEGADERM 4X4.75 (GAUZE/BANDAGES/DRESSINGS) IMPLANT
ELECT REM PT RETURN 15FT ADLT (MISCELLANEOUS) ×1 IMPLANT
EVACUATOR SILICONE 100CC (DRAIN) IMPLANT
GLOVE INDICATOR 6.5 STRL GRN (GLOVE) ×1 IMPLANT
GLOVE SURG MICRO LTX SZ6 (GLOVE) ×1 IMPLANT
GOWN STRL REUS W/ TWL LRG LVL3 (GOWN DISPOSABLE) ×1 IMPLANT
GRASPER SUT TROCAR 14GX15 (MISCELLANEOUS) ×1 IMPLANT
IRRIGATION SUCT STRKRFLW 2 WTP (MISCELLANEOUS) ×1 IMPLANT
KIT BASIN OR (CUSTOM PROCEDURE TRAY) ×1 IMPLANT
KIT IMAGING PINPOINTPAQ (MISCELLANEOUS) IMPLANT
KIT TURNOVER KIT A (KITS) ×1 IMPLANT
LHOOK LAP DISP 36CM (ELECTROSURGICAL) ×1 IMPLANT
NDL INSUFFLATION 14GA 120MM (NEEDLE) ×1 IMPLANT
NEEDLE INSUFFLATION 14GA 120MM (NEEDLE) ×1 IMPLANT
NS IRRIG 1000ML POUR BTL (IV SOLUTION) ×1 IMPLANT
PENCIL BUTTON HOLSTER BLD 10FT (ELECTRODE) ×1 IMPLANT
POUCH LAPAROSCOPIC INSTRUMENT (MISCELLANEOUS) ×1 IMPLANT
SCISSORS LAP 5X35 DISP (ENDOMECHANICALS) ×1 IMPLANT
SET CHOLANGIOGRAPH MIX (MISCELLANEOUS) IMPLANT
SET TUBE SMOKE EVAC HIGH FLOW (TUBING) ×1 IMPLANT
SHEARS HARMONIC 36 ACE (MISCELLANEOUS) IMPLANT
SLEEVE Z-THREAD 5X100MM (TROCAR) ×2 IMPLANT
SPONGE DRAIN TRACH 4X4 STRL 2S (GAUZE/BANDAGES/DRESSINGS) IMPLANT
SUT ETHILON 2 0 PS N (SUTURE) IMPLANT
SUT MNCRL AB 4-0 PS2 18 (SUTURE) ×1 IMPLANT
SYSTEM BAG RETRIEVAL 10MM (BASKET) ×1 IMPLANT
TOWEL GREEN STERILE FF (TOWEL DISPOSABLE) ×1 IMPLANT
TRAY LAPAROSCOPIC (CUSTOM PROCEDURE TRAY) ×1 IMPLANT
TROCAR 11X100 Z THREAD (TROCAR) IMPLANT
TROCAR Z THREAD OPTICAL 12X100 (TROCAR) ×1 IMPLANT
TROCAR Z-THREAD OPTICAL 5X100M (TROCAR) ×1 IMPLANT

## 2023-11-18 NOTE — Consult Note (Addendum)
 Cardiology Consultation   Patient ID: Kristopher Padilla MRN: [id number]; DOB: 13-Jun-1941  Admit date: 11/17/2023 Date of Consult: 11/18/2023  PCP:  Roselind Congo, MD   [facility] Providers Cardiologist:  Knox Perl, MD        Patient Profile:   Kristopher Padilla is a 83 y.o. male with a history of atypical chest pain, hypertension, hyperlipidemia, CKD stage IIIa who is being seen 11/18/2023 for preop evaluation at the request of Dr. Randal Bury.  History of Present Illness:   Kristopher Padilla is a 83 year old male with the above history.  He was recently referred to Dr. Berry Bristol and seen on 11/07/2023 for further evaluation of chest pain.  He described this as a "punch in the chest" and stated it was located on the left side of his chest. It was not associated with physical activity. The pain was reproducible with certain movements indicating possible musculoskeletal origin.  He also described a couple episodes of near syncope at that visit. ETT and ZIO monitor were ordered for further evaluation.  ETT is scheduled for later this month on 12/01/2023.  He presented to the ED on 11/17/2023 for further evaluation of right upper quadrant pain for the last 3 days.  Abdominal ultrasound showed that the gallbladder was full of stones and biliary sludge with mild gallbladder wall thickening along the hepatic surface measuring 5 mm as well as mild right-sided hydronephrosis.  LFTs and lipase were normal.  He was started on antibiotics for suspected acute cholecystitis.  General Surgery was consulted recommended Cardiology see for perioperative restratification given ongoing outpatient workup.  If felt to be at acceptable risk for surgery, they would like to proceed with laparoscopic cholecystectomy.  At the time of this evaluation, patient is resting comfortably in no acute distress. He states he really has not much of the chest pain that he saw Dr. Berry Bristol for lately. He describes this pain to be as a "poking sensation"  on the left side of his chest that occurs at rest or during stressful situations. The pain can last for up to 10 minutes at a time and then resolve. However, he denies any exertional chest pain/ discomfort to me. He is an active gentleman and push mows his lawn. He denies any chest pain or shortness of breath with this. No orthopnea or edema. He notes occasional lower extremity edema but none on exam today. No palpitations. He describes occasional very mild lightheadedness if he stands too quickly but no significant dizziness, falls, or near syncope/ syncope. He does states that sometimes his heart rate will be in the high 40s to low 50s when he checks his vital signs in the morning but it does not sound like has any symptoms at these times. Per review of Dr. Maida Sciara rent office visit note, it looks like the ETT was ordered for assess of bradycardia and to rule out heart block rather than for evaluation of his atypical chest pain.   Past Medical History:  Diagnosis Date   Kidney stones     History reviewed. No pertinent surgical history.    Inpatient Medications: Scheduled Meds:  famotidine   20 mg Oral QHS   latanoprost   1 drop Both Eyes QHS   polyethylene glycol  17 g Oral Daily   senna-docusate  1 tablet Oral BID   tamsulosin   0.4 mg Oral QPC breakfast   Continuous Infusions:  lactated ringers  100 mL/hr at 11/17/23 1320   piperacillin -tazobactam (ZOSYN )  IV 3.375 g (  11/18/23 0356)   PRN Meds: acetaminophen  **OR** acetaminophen , alum & mag hydroxide-simeth, hydrALAZINE , HYDROcodone -acetaminophen , HYDROmorphone  (DILAUDID ) injection, ondansetron  **OR** ondansetron  (ZOFRAN ) IV  Allergies:   No Known Allergies  Social History:   Social History   Socioeconomic History   Marital status: Married    Spouse name: Not on file   Number of children: Not on file   Years of education: Not on file   Highest education level: Not on file  Occupational History   Not on file  Tobacco Use    Smoking status: Never   Smokeless tobacco: Not on file  Substance and Sexual Activity   Alcohol use: No   Drug use: Not on file   Sexual activity: Not on file  Other Topics Concern   Not on file  Social History Narrative   Not on file   Social Drivers of Health   Financial Resource Strain: Not on file  Food Insecurity: Patient Declined (11/17/2023)   Hunger Vital Sign    Worried About Running Out of Food in the Last Year: Patient declined    Ran Out of Food in the Last Year: Patient declined  Transportation Needs: Patient Declined (11/17/2023)   PRAPARE - Administrator, Civil Service (Medical): Patient declined    Lack of Transportation (Non-Medical): Patient declined  Physical Activity: Not on file  Stress: Not on file  Social Connections: Patient Declined (11/17/2023)   Social Connection and Isolation Panel [NHANES]    Frequency of Communication with Friends and Family: Patient declined    Frequency of Social Gatherings with Friends and Family: Patient declined    Attends Religious Services: Patient declined    Database administrator or Organizations: Patient declined    Attends Banker Meetings: Patient declined    Marital Status: Patient declined  Intimate Partner Violence: Patient Declined (11/17/2023)   Humiliation, Afraid, Rape, and Kick questionnaire    Fear of Current or Ex-Partner: Patient declined    Emotionally Abused: Patient declined    Physically Abused: Patient declined    Sexually Abused: Patient declined    Family History:   Family History  Problem Relation Age of Onset   Heart disease Paternal Grandfather        "cardiac thrombosis"     ROS:  Please see the history of present illness.      Physical Exam/Data:   Vitals:   11/17/23 2159 11/17/23 2205 11/18/23 0151 11/18/23 0608  BP: (!) 180/68 (!) 167/67 (!) 156/65 (!) 140/59  Pulse: (!) 102 100 87 75  Resp:   18 18  Temp:   98.1 F (36.7 C) 97.9 F (36.6 C)  TempSrc:   Oral  Oral  SpO2:   95% 96%  Weight:        Intake/Output Summary (Last 24 hours) at 11/18/2023 0731 Last data filed at 11/18/2023 0600 Gross per 24 hour  Intake 2601.33 ml  Output 500 ml  Net 2101.33 ml      11/17/2023    9:44 AM 11/07/2023    2:30 PM 10/13/2019   12:35 PM  Last 3 Weights  Weight (lbs) 204 lb 212 lb 215 lb  Weight (kg) 92.534 kg 96.163 kg 97.523 kg     Body mass index is 29.27 kg/m.  General: 83 y.o. obese Caucasian male resting comfortably in no acute distress. HEENT: Normocephalic and atraumatic. Sclera clear.  Neck: Supple. No JVD. Heart: RRR. Distinct S1 and S2. No murmurs, gallops, or rubs. Radial and distal  pedal pulses 2+ and equal bilaterally. Lungs: No increased work of breathing. Clear to ausculation bilaterally. No wheezes, rhonchi, or rales.  MSK: Normal strength and tone for age. Extremities: No lower extremity edema. Skin: Warm and dry. Neuro: Alert and oriented x3. No focal deficits. Psych: Normal affect. Responds appropriately.   EKG:  The EKG was personally reviewed and demonstrates: Sinus rhythm, rate 73 beats minute, with no acute ischemic changes. Telemetry:  Not currently on telemetry.  Relevant CV Studies: None.  Laboratory Data:  High Sensitivity Troponin:  No results for input(s): "TROPONINIHS" in the last 720 hours.   Chemistry Recent Labs  Lab 11/17/23 0946 11/18/23 0441  NA 135 134*  K 4.3 4.0  CL 98 99  CO2 23 24  GLUCOSE 134* 139*  BUN 18 23  CREATININE 1.35* 1.25*  CALCIUM 9.7 9.1  GFRNONAA 53* 58*  ANIONGAP 14 11    Recent Labs  Lab 11/17/23 0946 11/18/23 0441  PROT 7.2 6.7  ALBUMIN 4.0 3.4*  AST 23 20  ALT 16 19  ALKPHOS 51 43  BILITOT 1.1 1.3*   Lipids No results for input(s): "CHOL", "TRIG", "HDL", "LABVLDL", "LDLCALC", "CHOLHDL" in the last 168 hours.  Hematology Recent Labs  Lab 11/17/23 0946 11/18/23 0441  WBC 23.5* 23.3*  RBC 5.27 4.86  HGB 16.0 14.8  HCT 46.7 45.7  MCV 88.6 94.0  MCH 30.4 30.5   MCHC 34.3 32.4  RDW 12.3 12.3  PLT 215 229   Thyroid  No results for input(s): "TSH", "FREET4" in the last 168 hours.  BNPNo results for input(s): "BNP", "PROBNP" in the last 168 hours.  DDimer No results for input(s): "DDIMER" in the last 168 hours.   Radiology/Studies:  US  ABDOMEN LIMITED RUQ (LIVER/GB) Result Date: 11/17/2023 CLINICAL DATA:  151471 RUQ pain 151471 EXAM: ULTRASOUND ABDOMEN LIMITED RIGHT UPPER QUADRANT COMPARISON:  June 29, 2023 FINDINGS: Gallbladder: The gallbladder is full of stones and sludge. Mild gallbladder wall thickening measuring 5 mm. No sonographic Murphy's sign noted by sonographer. Common bile duct: Diameter: 3 mm Liver: Normal echogenicity. A few small cysts present measuring up to 2 cm. No intrahepatic biliary ductal dilation. Portal vein is patent on color Doppler imaging with normal direction of blood flow towards the liver. Other: Mild right-sided hydronephrosis. IMPRESSION: 1. Mild right-sided hydronephrosis. 2. The gallbladder is full of stones and biliary sludge. Mild gallbladder wall thickening along the hepatic surface of the gallbladder, measuring 5 mm. In the absence of a sonographic Murphy's sign and pericholecystic fluid, these changes may be related to acute hepatitis, upper abdominal inflammation, or volume overload, either from CHF or renal failure. Laboratory correlation recommended. Electronically Signed   By: Rance Burrows M.D.   On: 11/17/2023 12:09    Assessment and Plan:   Pre-Op Evaluation Patient was admitted for acute cholecystitis. He was recently seen by Dr. Berry Bristol in the office for atypical chest pain. He also reported intermittent bradycardia with rate in the high 40s to low 50s and some occasional mild dizziness with standing. ETT and Zio monitor were ordered for further evaluation of bradycardia. His chest pain was not felt to be cardiac in nature and ETT was not ordered for this. He is still wearing the Zio monitor but it is a  non-live monitor so we are not able to get any results from this. I agree that his chest pain does not sound cardiac in nature. He is able to complete >4.0 METS. In fact, he is able to push mow  his lawn without any chest pain or shortness of breath. Revised Cardiac Risk Index score = 1 (intraperitoneal surgery) indicating a 6.0% risk of major cardiac event perioperatively. He is not currently on telemetry but there has been no documented bradycardia. Recommend placing patient on telemetry perioperatively given some history of bradycardia.  Do not anticipate that he is going to need any additional cardiac work-up prior to surgery. However, I will review with MD for final recommendations.   Intermittent Bradycardia Patient reports of history of intermittent bradycardia with heart rates in the high 40s to 50s at times as well as intermittent dizziness. However, no near syncope/ syncope. ETT and Zio monitor were recently ordered as an outpatient for further evaluation of this. He is currently wearing the monitor but it is a non-live Zio so will not be able to see any results until monitor is completed. - Not currently on telemetry but no documented bradycardia. - Will place on telemetry.  - Can complete outpatient work-up after discharge. ETT is scheduled for later this month.   Otherwise, per primary team: - Sepsis secondary to acute cholecystitis  - Hypertension - Hyperlipidemia - CKD stage IIIa    Risk Assessment/Risk Scores:  {  For questions or updates, please contact [facility] Please consult [email or web address] for contact info under    Signed, Kristopher E Goodrich, PA-C  11/18/2023 8:17 AM   ADDENDUM: Spoke with Dr. Avanell Bob who recommended limited Echo prior to surgery just to make sure EF is okay. If EF is normal, okay to proceed with surgery. If EF is down, will need additional ischemic evaluation prior to surgery. Dr. Avanell Bob will see patient as well.  Kristopher E Goodrich,  PA-C 11/18/2023 9:13 AM  Patient seen and examined   I agree with findings as noted above by C Padilla Pt is an 83 yo who was admitted for abdominal pain   Being evaluated for cholecystectomy The pt has no known CAD   Hx of atypical CP   Seen by Luwanna Sam recently   Plan forfora a treadmill test to evaluate dizziness, feeling like HR slow at times  He is able to mow the lawn with push mower without problem  On exam Neck;  JVP is normal  Lungs are CTA Cardiac RRR  No S3  No murmurs  Abd deferred Ext are without edema      Limited echo LVEF and RVEF are normal  Overall I think pt is low risk for major cardiac event and OK to proceed with surgery without further testing     Keep on telemetry after       Kristopher Berger MD

## 2023-11-18 NOTE — TOC Initial Note (Signed)
 Transition of Care [facility]) - Initial/Assessment Note    Patient Details  Name: Kristopher Padilla MRN: [id number] Date of Birth: [birth year]  Transition of Care [facility]) CM/SW Contact:    Levie Ream, RN Phone Number: 11/18/2023, 10:43 AM  Clinical Narrative:                 Spoke w/ pt and spouse Akia Dinardi [phone]) in room; pt says he lives at home; he plans to return at d/c; his wife will provide transportation; pt verified insurance/PCP; he denied SDOH risks; pt says he does not have DME. HH services, or home oxygen; TOC will follow.  Expected Discharge Plan: Home/Self Care Barriers to Discharge: Continued Medical Work up   Patient Goals and CMS Choice Patient states their goals for this hospitalization and ongoing recovery are:: home CMS Medicare.gov Compare Post Acute Care list provided to:: Patient        Expected Discharge Plan and Services   Discharge Planning Services: CM Consult   Living arrangements for the past 2 months: Single Family Home                                      Prior Living Arrangements/Services Living arrangements for the past 2 months: Single Family Home Lives with:: Spouse Patient language and need for interpreter reviewed:: Yes Do you feel safe going back to the place where you live?: Yes      Need for Family Participation in Patient Care: Yes (Comment) Care giver support system in place?: Yes (comment) Current home services:  (n/a) Criminal Activity/Legal Involvement Pertinent to Current Situation/Hospitalization: No - Comment as needed  Activities of Daily Living   ADL Screening (condition at time of admission) Independently performs ADLs?: Yes (appropriate for developmental age) Is the patient deaf or have difficulty hearing?: No Does the patient have difficulty seeing, even when wearing glasses/contacts?: No Does the patient have difficulty concentrating, remembering, or making decisions?: No  Permission  Sought/Granted Permission sought to share information with : Case Manager Permission granted to share information with : Yes, Verbal Permission Granted  Share Information with NAME: Case Manager     Permission granted to share info w Relationship: Echo Topp (spouse) [phone]     Emotional Assessment Appearance:: Appears stated age Attitude/Demeanor/Rapport: Gracious Affect (typically observed): Accepting Orientation: : Oriented to Self, Oriented to Place, Oriented to  Time, Oriented to Situation Alcohol / Substance Use: Not Applicable Psych Involvement: No (comment)  Admission diagnosis:  Acute cholecystitis [K81.0] Acute cholecystitis due to biliary calculus [K80.00] Patient Active Problem List   Diagnosis Date Noted   Acute cholecystitis 11/17/2023   Essential hypertension 11/17/2023   Hyperlipidemia 11/17/2023   Hyperglycemia 11/17/2023   Angina pectoris (HCC) 11/07/2023   PCP:  Roselind Congo, MD Pharmacy:   [facility] [address] [place] - [address] [place] [address] Phone: [phone] Fax: [phone]  [id number] - [place], [place] - [address] [place] [address] Phone: [phone] Fax: [phone]     Social Drivers of Health (SDOH) Social History: SDOH Screenings   Food Insecurity: No Food Insecurity (11/18/2023)  Housing: Low Risk  (11/18/2023)  Transportation Needs: No Transportation Needs (11/18/2023)  Utilities: Not At Risk (11/18/2023)  Social Connections: Patient Declined (11/17/2023)  Tobacco Use: Unknown (11/17/2023)   SDOH Interventions: Food Insecurity Interventions: Intervention Not Indicated, Inpatient TOC  Housing Interventions: Intervention Not Indicated, Inpatient TOC Transportation Interventions: Intervention Not Indicated, Inpatient TOC Utilities Interventions: Intervention Not Indicated, Inpatient TOC   Readmission Risk Interventions     No data to display

## 2023-11-18 NOTE — Progress Notes (Signed)
 [facility]                                                                              Kristopher Padilla, is a 83 y.o. male, DOB - 1940/09/22, ZOX:[id number] Admit date - 11/17/2023    Outpatient Primary MD for the patient is [name], MD  LOS - 0  days  Chief Complaint  Patient presents with   Abdominal Pain       Brief summary   Patient is a 83 year old male with HTN, HLP, aortic atherosclerosis presented to ED with abdominal pain.  Patient reported severe epigastric and right upper quadrant abdominal pain with nausea 5 days prior to admission, Poor appetite and has not had much to eat in the last 2 to 3 days.  He initially thought he may have kidney stones, followed up with urology and underwent outpatient CT renal stone study which showed pericholecystic stranding, punctate nonobstructing bilateral renal calculi without hydronephrosis, multiple bladder stones, no SBO and normal appendix.  Patient was referred to ED. Patient also reported constipation for 3 days with bloating. Patient also reported that he saw Dr. Berry Bristol of cardiology on 11/07/2023 for dizziness and  "jabs in his chest", had event monitor, was scheduled for a future exercise stress test.   Admitted for further workup.  Assessment & Plan   Sepsis with acute cholecystitis -Patient met sepsis criteria on admission with tachycardia, borderline BP, leukocytosis, mild AKI, source likely due to acute cholecystitis -Outpatient CT with pericholecystic stranding, no bowel obstruction and normal appendix. - RUQ ultrasound showed cholelithiasis with mild gallbladder wall thickening of 5 mm - N.p.o., surgery following, timing for cholecystectomy TBD pending cardiology workup  - Continue pain control, IV fluids, antiemetics, IV Zosyn . - + Leukocytosis, procalcitonin 0.34, blood cultures NTD    ?  Angina equivalent/preop evaluation - Per patient he has been having jabs in his chest intermittently although  no frank chest pain, shortness of breath, DOE.   - Cardiology following, plan for limited echo     Essential hypertension -Will place on IV hydralazine  as needed with parameters. -Hold lisinopril due to mild AKI   Acute renal sufficiency -Creatinine 1.35 on admission, creatinine was 1.2 in 06/2023 likely due to poor p.o. intake, lisinopril  - Continue gentle hydration, creatinine improving, at baseline     Hyperglycemia  -Noted glucose 134 on presentation and was 188 on CMP in 06/2023 - HbA1c 5.3   Constipation - Resolved, changed to as needed stool softeners  Obesity class I Estimated body mass index is 29.27 kg/m as calculated from the following:   Height as of this encounter: 5\' 10"  (1.778 m).   Weight as of this encounter: 92.5 kg.  Code Status: Full code DVT Prophylaxis:  SCDs Start: 11/17/23 1754   Level of Care: Level of care: Med-Surg Family Communication: Updated patient Disposition Plan:      Remains inpatient appropriate:      Procedures:    Consultants:   Cardiology Surgery  Antimicrobials:   Anti-infectives (From admission, onward)    Start     Dose/Rate Route Frequency Ordered Stop   11/17/23 2000  piperacillin -tazobactam (ZOSYN ) IVPB 3.375 g        3.375 g 12.5 mL/hr over 240 Minutes Intravenous Every 8 hours 11/17/23 1233     11/17/23 1245  piperacillin -tazobactam (ZOSYN ) IVPB 3.375 g        3.375 g 100 mL/hr over 30 Minutes Intravenous  Once 11/17/23 1233 11/17/23 1328          Medications  famotidine   20 mg Oral QHS   latanoprost   1 drop Both Eyes QHS   polyethylene glycol  17 g Oral Daily   senna-docusate  1 tablet Oral BID   tamsulosin   0.4 mg Oral QPC breakfast      Subjective:   Kristopher Padilla was seen and examined today.  Still having RUQ abdominal pain, no chest pain, dizziness, shortness of breath, fevers.  Constipation resolved.   Objective:   Vitals:   11/17/23 2205 11/18/23 0151 11/18/23 0608 11/18/23 0911  BP: (!)  167/67 (!) 156/65 (!) 140/59   Pulse: 100 87 75   Resp:  18 18   Temp:  98.1 F (36.7 C) 97.9 F (36.6 C)   TempSrc:  Oral Oral   SpO2:  95% 96%   Weight:    92.5 kg  Height:    5\' 10"  (1.778 m)    Intake/Output Summary (Last 24 hours) at 11/18/2023 0958 Last data filed at 11/18/2023 0859 Gross per 24 hour  Intake 2601.33 ml  Output 500 ml  Net 2101.33 ml     Wt Readings from Last 3 Encounters:  11/18/23 92.5 kg  11/07/23 96.2 kg  10/13/19 97.5 kg     Exam General: Alert and oriented x 3, NAD Cardiovascular: S1 S2 auscultated,  RRR Respiratory: Clear to auscultation bilaterally, no wheezing, rales or rhonchi Gastrointestinal: Soft, RUQ TTP, ND, NBS  Ext: no pedal edema bilaterally Neuro: no new deficits Psych: Normal affect     Data Reviewed:  I have personally reviewed following labs    CBC Lab Results  Component Value Date   WBC 23.3 (H) 11/18/2023   RBC 4.86 11/18/2023   HGB 14.8 11/18/2023   HCT 45.7 11/18/2023   MCV 94.0 11/18/2023   MCH 30.5 11/18/2023   PLT 229 11/18/2023   MCHC 32.4 11/18/2023   RDW 12.3 11/18/2023   LYMPHSABS 0.9 06/15/2013   MONOABS 0.6 06/15/2013   EOSABS 0.1 06/15/2013   BASOSABS 0.0 06/15/2013     Last metabolic panel Lab Results  Component Value Date   NA 134 (L) 11/18/2023   K 4.0 11/18/2023   CL 99 11/18/2023   CO2 24 11/18/2023   BUN 23 11/18/2023   CREATININE 1.25 (H) 11/18/2023   GLUCOSE 139 (H) 11/18/2023   GFRNONAA 58 (L) 11/18/2023   GFRAA >60 10/13/2019   CALCIUM 9.1 11/18/2023   PROT 6.7 11/18/2023   ALBUMIN 3.4 (L) 11/18/2023   BILITOT 1.3 (H) 11/18/2023   ALKPHOS 43 11/18/2023   AST 20 11/18/2023   ALT 19 11/18/2023   ANIONGAP 11 11/18/2023    CBG (last 3)  No results for input(s): "GLUCAP" in the last 72 hours.    Coagulation Profile: No results for input(s): "INR", "PROTIME" in the last 168 hours.   Radiology Studies: I have personally reviewed the imaging studies  US  ABDOMEN LIMITED  RUQ (LIVER/GB) Result Date: 11/17/2023 CLINICAL DATA:  151471 RUQ pain 151471 EXAM: ULTRASOUND ABDOMEN LIMITED RIGHT UPPER QUADRANT COMPARISON:  June 29, 2023 FINDINGS: Gallbladder: The gallbladder is full of stones and sludge. Mild gallbladder  wall thickening measuring 5 mm. No sonographic Murphy's sign noted by sonographer. Common bile duct: Diameter: 3 mm Liver: Normal echogenicity. A few small cysts present measuring up to 2 cm. No intrahepatic biliary ductal dilation. Portal vein is patent on color Doppler imaging with normal direction of blood flow towards the liver. Other: Mild right-sided hydronephrosis. IMPRESSION: 1. Mild right-sided hydronephrosis. 2. The gallbladder is full of stones and biliary sludge. Mild gallbladder wall thickening along the hepatic surface of the gallbladder, measuring 5 mm. In the absence of a sonographic Murphy's sign and pericholecystic fluid, these changes may be related to acute hepatitis, upper abdominal inflammation, or volume overload, either from CHF or renal failure. Laboratory correlation recommended. Electronically Signed   By: Rance Burrows M.D.   On: 11/17/2023 12:09       Toni Hoffmeister M.D. [facility] 11/18/2023, 9:58 AM  Available via Epic secure chat 7am-7pm After 7 pm, please refer to night coverage provider listed on amion.

## 2023-11-18 NOTE — Progress Notes (Signed)
 Subjective: CC: Continued RUQ abdominal pain and nausea. Also feels bloated. No vomiting. BM earlier this morning.   Tmax 100. Tachycardia resolved. Not on a BB. No hypotension. WBC 23.3 (23.5). T. Bili 1.3 (1.1). LFT's otherwise wnl.    Objective: Vital signs in last 24 hours: Temp:  [97.9 F (36.6 C)-100 F (37.8 C)] 97.9 F (36.6 C) (05/09 0608) Pulse Rate:  [74-108] 75 (05/09 0608) Resp:  [14-19] 18 (05/09 0608) BP: (114-180)/(59-76) 140/59 (05/09 0608) SpO2:  [92 %-97 %] 96 % (05/09 0608) Weight:  [92.5 kg] 92.5 kg (05/08 0944) Last BM Date : 11/17/23  Intake/Output from previous day: 05/08 0701 - 05/09 0700 In: 2601.3 [P.O.:360; I.V.:1116.7; IV Piggyback:1124.7] Out: 500 [Urine:500] Intake/Output this shift: No intake/output data recorded.  PE: Gen:  Alert, NAD, pleasant Abd: Soft, mild distension, right hemiabdomen ttp that is greatest in the RUQ with equivocal murphy's sign. +BS  Lab Results:  Recent Labs    11/17/23 0946 11/18/23 0441  WBC 23.5* 23.3*  HGB 16.0 14.8  HCT 46.7 45.7  PLT 215 229   BMET Recent Labs    11/17/23 0946 11/18/23 0441  NA 135 134*  K 4.3 4.0  CL 98 99  CO2 23 24  GLUCOSE 134* 139*  BUN 18 23  CREATININE 1.35* 1.25*  CALCIUM 9.7 9.1   PT/INR No results for input(s): "LABPROT", "INR" in the last 72 hours. CMP     Component Value Date/Time   NA 134 (L) 11/18/2023 0441   K 4.0 11/18/2023 0441   CL 99 11/18/2023 0441   CO2 24 11/18/2023 0441   GLUCOSE 139 (H) 11/18/2023 0441   BUN 23 11/18/2023 0441   CREATININE 1.25 (H) 11/18/2023 0441   CALCIUM 9.1 11/18/2023 0441   PROT 6.7 11/18/2023 0441   ALBUMIN 3.4 (L) 11/18/2023 0441   AST 20 11/18/2023 0441   ALT 19 11/18/2023 0441   ALKPHOS 43 11/18/2023 0441   BILITOT 1.3 (H) 11/18/2023 0441   GFRNONAA 58 (L) 11/18/2023 0441   GFRAA >60 10/13/2019 1244   Lipase     Component Value Date/Time   LIPASE 20 11/17/2023 0946    Studies/Results: US  ABDOMEN  LIMITED RUQ (LIVER/GB) Result Date: 11/17/2023 CLINICAL DATA:  [id number] RUQ pain 151471 EXAM: ULTRASOUND ABDOMEN LIMITED RIGHT UPPER QUADRANT COMPARISON:  June 29, 2023 FINDINGS: Gallbladder: The gallbladder is full of stones and sludge. Mild gallbladder wall thickening measuring 5 mm. No sonographic Murphy's sign noted by sonographer. Common bile duct: Diameter: 3 mm Liver: Normal echogenicity. A few small cysts present measuring up to 2 cm. No intrahepatic biliary ductal dilation. Portal vein is patent on color Doppler imaging with normal direction of blood flow towards the liver. Other: Mild right-sided hydronephrosis. IMPRESSION: 1. Mild right-sided hydronephrosis. 2. The gallbladder is full of stones and biliary sludge. Mild gallbladder wall thickening along the hepatic surface of the gallbladder, measuring 5 mm. In the absence of a sonographic Murphy's sign and pericholecystic fluid, these changes may be related to acute hepatitis, upper abdominal inflammation, or volume overload, either from CHF or renal failure. Laboratory correlation recommended. Electronically Signed   By: Rance Burrows M.D.   On: 11/17/2023 12:09    Anti-infectives: Anti-infectives (From admission, onward)    Start     Dose/Rate Route Frequency Ordered Stop   11/17/23 2000  piperacillin -tazobactam (ZOSYN ) IVPB 3.375 g        3.375 g 12.5 mL/hr over 240 Minutes Intravenous Every 8 hours  11/17/23 1233     11/17/23 1245  piperacillin -tazobactam (ZOSYN ) IVPB 3.375 g        3.375 g 100 mL/hr over 30 Minutes Intravenous  Once 11/17/23 1233 11/17/23 1328        Assessment/Plan Acute Cholecystitis  This is a 83 year old male who presented with several days of severe epigastric/RUQ abdominal pain with associated nausea.  His outpatient CT scan showed evidence of pericholecystic stranding, no bowel obstruction, and a normal appendix.  In the ED his RUQ US  showed cholelithiasis with mild gallbladder wall thickening of 5 mm.    He is afebrile without tachycardia or hypotension this am. WBC 23.3 (23.5). T. Bili 1.3 (1.1). LFT's otherwise wnl.  Lipase wnl on presentation.    Given patient is currently being worked up by cardiology for possible angina with a future stress test planned I think it would be best to have Cardiology see the patient for perioperative risk stratification and determine if he needs further testing before undergoing general anesthesia. Cont abx. If patient was to acutely worsen, would recommend Perc Chole.    If acceptable risk for surgery then would pursue laparoscopic cholecystectomy during this hospitalization.   If high surgical risk can pursue perc chole tube.  Discussed that this drain would stay in place for at least 6 weeks and the subsequent pathways that could follow including discussions of elective Cholecystectomy in the future once optimized from cardiac standpoint.   I updated his wife over the phone. Cont to trend labs.   FEN - NPO till final cards recs VTE - Ok for VTE ppx from surgical standpoint ID - Zosyn   I reviewed nursing notes, hospitalist notes, last 24 h vitals and pain scores, last 48 h intake and output, last 24 h labs and trends, and last 24 h imaging results.   LOS: 0 days    Delton Filbert, [facility] Surgery 11/18/2023, 8:28 AM Please see Amion for pager number during day hours 7:00am-4:30pm

## 2023-11-18 NOTE — Op Note (Signed)
 11/18/2023 6:11 PM  PATIENT: Kristopher Padilla  83 y.o. male  PRE-OPERATIVE DIAGNOSIS: acute cholecystitis with cholelithiasis  POST-OPERATIVE DIAGNOSIS: acute cholecystitis with cholelithiasis  PROCEDURE: laparoscopic subtotal cholecystectomy with drain placement  SURGEON: Freddrick Jaffe, MD  ASSISTANT: Aleene Hurry, MD  ANESTHESIA: General endotracheal  EBL: 10cc  DRAINS: 19 Fr blake drain in gallbladder fossa  SPECIMEN: Gallbladder  COUNTS: Sponge, needle and instrument counts were reported correct x2 at the conclusion of the operation  DISPOSITION: PACU in satisfactory condition  COMPLICATIONS: Unable to achieve critical view prompting subtotal cholecystectomy.  FINDINGS: Unable to identify cystic duct or artery given extensive inflammation and edema. Subtotal cholecystectomy performed without reconstitution. 19 Fr Blake drain left in gallbladder fossa. Hemostatic at completion of case. No obvious stones left. No obvious bile leaking from gallbladder stump.  DESCRIPTION:  The patient was identified & brought into the operating room. He was then positioned supine on the OR table. SCDs were in place and active during the entire case. He then underwent general endotracheal anesthesia. Pressure points were padded. Hair on the abdomen was clipped by the OR team. The abdomen was prepped and draped in the standard sterile fashion. Antibiotics were administered. A surgical timeout was performed and confirmed our plan.   A small incision was made in the LUQ at Palmer's point and a veress needle was inserted. Air was aspirated and subsequent positive drop test. Abdomen then insufflated to . A periumbilical incision was then made and a 5mm trocar optiview using a 30 degree scope was inserted and the abdomen was entered under direct visualization. Inspection confirmed no evidence of trocar or veress site complications. The veress was then removed.   The patient was then positioned in reverse  Trendelenburg with slight left side down. A 12 mm supxiphoid trocar was placed under direct visualization and  two additional 5mm trocars were placed along the right subcostal line - one 5mm port in mid subcostal region, another 5mm port in the right flank near the anterior axillary line.  The liver and gallbladder were inspected. Extensive inflammation and edema. Omental adhesions were identified and bluntly dissected free from gallbladder wall. Unable to grasp gallbladder fundus initially which required decompression of bile from gallbladder using needle tip suction. The gallbladder fundus was attempted to be grasped and elevated cephalad. Throughout the case grasping the gallbladder was difficult due to gallbladder wall edema even with toothed graspers. Was able to free superior portion of gallbladder and place a grasper on this. Attempted top down dissection but again, due to inflammation and edema, could not safely identify anatomy or landmarks. At this point, I called in my partner, Dr. Alethea Andes, for assistance with the case. After further evaluation it was felt that the risks of inadvertently injuring structures and causing further bleeding that would obscure our view far outweighed benefits of further dissection.   We elected to use a harmonic scalpel to divide the gallbladder at a point safely above any critical structures. We carried dissection superiorly to encompass from and side wall of gallbladder leaving back wall on liver. Numerous stones were suctioned and cleared from gallbladder. Once we safely removed anterior and side walls of gallbladder above chosen point of transection, an endocatch bag was inserted and this portion of gallbladder and large stones placed into bag.  We then turned our attention back to back wall of gallbladder. All bleeding edges were cauterized until hemostasis. Removed any remaining stones both manually with graspers and through irrigation. 2L of saline were used  for  copious irrigation to ensure hemostasis and complete retrieval of stones. At this point no obvious bile leaking from gallbladder remnant. Given inflammation and edema below superior edge of remnant, in addition to no obvious leak, felt safer not to perform reconstitution of remnant.  A cholangiogram was not performed at this point as could not safely identify cystic duct in order to perform.  A 19 Fr blake drain was placed in the gallbladder fossa adjacent to gallbladder remnant and fed through most lateral right port site. This was secured with 2-0 nylon.  The endocatch bag containing the gallbladder was then removed from the subxiphoid port site and passed off as specimen. The subxiphoid port fascia was then closed in a figured of eight fashion with 0 vicryl using a suture passer. The RUQ port were removed under direct visualization and noted to be hemostatic.[name] The fascia was palpated and noted to be completely closed. The abdomen was then desufflated and the periumbilical trocar removed. The skin of all incision sites was approximated with 4-0 monocryl subcuticular suture and dermabond applied. The patient was then awakened from anesthesia, extubated, and transferred to a stretcher for transport to PACU in satisfactory condition.  Instrument, sponge, and needle counts were correct at closure and at the conclusion of the case.   Freddrick Jaffe, MD [facility] Surgery

## 2023-11-18 NOTE — Progress Notes (Signed)
*  PRELIMINARY RESULTS* Echocardiogram 2D Echocardiogram has been performed.  Shenee Wignall D Lloyd Cullinan 11/18/2023, 10:00 AM

## 2023-11-18 NOTE — Anesthesia Preprocedure Evaluation (Addendum)
 Anesthesia Evaluation  Patient identified by MRN, date of birth, ID band Patient awake    Reviewed: Allergy & Precautions, NPO status , Patient's Chart, lab work & pertinent test results  History of Anesthesia Complications Negative for: history of anesthetic complications  Airway Mallampati: II  TM Distance: >3 FB Neck ROM: Full    Dental  (+) Dental Advisory Given, Teeth Intact   Pulmonary neg pulmonary ROS, neg recent URI   Pulmonary exam normal breath sounds clear to auscultation       Cardiovascular hypertension, Pt. on medications Normal cardiovascular exam Rhythm:Regular Rate:Normal  Echo 11/18/2023  1. Left ventricular ejection fraction, by estimation, is 65 to 70%. The left ventricle has normal function.   2. Right ventricular systolic function is normal. The right ventricular size is normal.   3. The aortic valve is tricuspid. There is moderate calcification of the aortic valve. Aortic valve regurgitation is trivial. Aortic valve sclerosis is present, with no evidence of aortic valve stenosis.      Neuro/Psych negative neurological ROS     GI/Hepatic Neg liver ROS,GERD  Medicated and Controlled,,CHOLECYSTITIS   Endo/Other  negative endocrine ROS    Renal/GU Renal diseaseCr 1.25     Musculoskeletal negative musculoskeletal ROS (+)    Abdominal   Peds  Hematology negative hematology ROS (+)   Anesthesia Other Findings Day of surgery medications reviewed with patient.  Reproductive/Obstetrics                             Anesthesia Physical Anesthesia Plan  ASA: 3  Anesthesia Plan: General   Post-op Pain Management: Tylenol  PO (pre-op)*   Induction: Intravenous  PONV Risk Score and Plan: 2 and Treatment may vary due to age or medical condition, Ondansetron  and Dexamethasone  Airway Management Planned: Oral ETT  Additional Equipment: None  Intra-op Plan:    Post-operative Plan: Extubation in OR  Informed Consent: I have reviewed the patients History and Physical, chart, labs and discussed the procedure including the risks, benefits and alternatives for the proposed anesthesia with the patient or authorized representative who has indicated his/her understanding and acceptance.     Dental advisory given  Plan Discussed with: CRNA  Anesthesia Plan Comments:         Anesthesia Quick Evaluation

## 2023-11-18 NOTE — Transfer of Care (Signed)
 Immediate Anesthesia Transfer of Care Note  Patient: Kristopher Padilla  Procedure(s) Performed: Procedure(s) with comments: LAPAROSCOPIC CHOLECYSTECTOMY WITH ICG DYE (N/A) - ICG  Patient Location: PACU  Anesthesia Type:General  Level of Consciousness:  sedated, patient cooperative and responds to stimulation  Airway & Oxygen Therapy:Patient Spontanous Breathing and Patient connected to face mask oxgen  Post-op Assessment:  Report given to PACU RN and Post -op Vital signs reviewed and stable  Post vital signs:  Reviewed and stable  Last Vitals:  Vitals:   11/18/23 1813 11/18/23 1815  BP: (!) 155/61 (!) 157/62  Pulse: 78 80  Resp: 11 15  Temp: 37.4 C   SpO2: 97% 98%    Complications: No apparent anesthesia complications

## 2023-11-18 NOTE — Anesthesia Procedure Notes (Signed)
 Procedure Name: Intubation Date/Time: 11/18/2023 4:08 PM  Performed by: Stasia Edelman, CRNAPre-anesthesia Checklist: Patient identified, Emergency Drugs available, Suction available and Patient being monitored Patient Re-evaluated:Patient Re-evaluated prior to induction Oxygen Delivery Method: Circle System Utilized Preoxygenation: Pre-oxygenation with 100% oxygen Induction Type: IV induction Ventilation: Mask ventilation without difficulty Laryngoscope Size: Mac and 4 Grade View: Grade I Tube type: Oral Tube size: 7.5 mm Number of attempts: 1 Airway Equipment and Method: Stylet Placement Confirmation: ETT inserted through vocal cords under direct vision, positive ETCO2 and breath sounds checked- equal and bilateral Secured at: 22 cm Tube secured with: Tape Dental Injury: Teeth and Oropharynx as per pre-operative assessment

## 2023-11-18 NOTE — Care Management Obs Status (Signed)
 MEDICARE OBSERVATION STATUS NOTIFICATION   Patient Details  Name: Kristopher Padilla MRN: [id number] Date of Birth: 03-23-1941   Medicare Observation Status Notification Given:  Yes    Levie Ream, RN 11/18/2023, 10:36 AM

## 2023-11-19 DIAGNOSIS — R739 Hyperglycemia, unspecified: Secondary | ICD-10-CM | POA: Diagnosis not present

## 2023-11-19 DIAGNOSIS — I10 Essential (primary) hypertension: Secondary | ICD-10-CM | POA: Diagnosis not present

## 2023-11-19 DIAGNOSIS — R001 Bradycardia, unspecified: Secondary | ICD-10-CM | POA: Diagnosis not present

## 2023-11-19 DIAGNOSIS — E7800 Pure hypercholesterolemia, unspecified: Secondary | ICD-10-CM | POA: Diagnosis not present

## 2023-11-19 LAB — HEPATIC FUNCTION PANEL
ALT: 55 U/L — ABNORMAL HIGH (ref 0–44)
AST: 49 U/L — ABNORMAL HIGH (ref 15–41)
Albumin: 2.8 g/dL — ABNORMAL LOW (ref 3.5–5.0)
Alkaline Phosphatase: 41 U/L (ref 38–126)
Bilirubin, Direct: 0.2 mg/dL (ref 0.0–0.2)
Indirect Bilirubin: 0.9 mg/dL (ref 0.3–0.9)
Total Bilirubin: 1.1 mg/dL (ref 0.0–1.2)
Total Protein: 5.8 g/dL — ABNORMAL LOW (ref 6.5–8.1)

## 2023-11-19 LAB — RENAL FUNCTION PANEL
Albumin: 2.7 g/dL — ABNORMAL LOW (ref 3.5–5.0)
Anion gap: 7 (ref 5–15)
BUN: 30 mg/dL — ABNORMAL HIGH (ref 8–23)
CO2: 26 mmol/L (ref 22–32)
Calcium: 8.7 mg/dL — ABNORMAL LOW (ref 8.9–10.3)
Chloride: 102 mmol/L (ref 98–111)
Creatinine, Ser: 1.35 mg/dL — ABNORMAL HIGH (ref 0.61–1.24)
GFR, Estimated: 53 mL/min — ABNORMAL LOW (ref >60)
Glucose, Bld: 134 mg/dL — ABNORMAL HIGH (ref 70–99)
Phosphorus: 3.7 mg/dL (ref 2.5–4.6)
Potassium: 4.8 mmol/L (ref 3.5–5.1)
Sodium: 135 mmol/L (ref 135–145)

## 2023-11-19 LAB — CBC
HCT: 38.3 % — ABNORMAL LOW (ref 39.0–52.0)
Hemoglobin: 12.3 g/dL — ABNORMAL LOW (ref 13.0–17.0)
MCH: 30.1 pg (ref 26.0–34.0)
MCHC: 32.1 g/dL (ref 30.0–36.0)
MCV: 93.9 fL (ref 80.0–100.0)
Platelets: 210 10*3/uL (ref 150–400)
RBC: 4.08 MIL/uL — ABNORMAL LOW (ref 4.22–5.81)
RDW: 12.4 % (ref 11.5–15.5)
WBC: 10.6 10*3/uL — ABNORMAL HIGH (ref 4.0–10.5)
nRBC: 0 % (ref 0.0–0.2)

## 2023-11-19 MED ORDER — LACTATED RINGERS IV SOLN: INTRAVENOUS | Status: AC

## 2023-11-19 NOTE — Progress Notes (Signed)
 1 Day Post-Op   Subjective/Chief Complaint: Complains of soreness. Hasn't taken much orally. No flatus yet   Objective: Vital signs in last 24 hours: Temp:  [97.5 F (36.4 C)-99.3 F (37.4 C)] 97.5 F (36.4 C) (05/10 0617) Pulse Rate:  [56-90] 63 (05/10 0919) Resp:  [11-19] 18 (05/10 0919) BP: (117-169)/(48-62) 150/62 (05/10 0919) SpO2:  [92 %-98 %] 93 % (05/10 0919) Weight:  [92.5 kg] 92.5 kg (05/09 1313) Last BM Date : 11/18/23  Intake/Output from previous day: 05/09 0701 - 05/10 0700 In: 190.5 [P.O.:60; IV Piggyback:130.5] Out: 160 [Drains:60; Blood:100] Intake/Output this shift: Total I/O In: 0  Out: 20 [Drains:20]  General appearance: alert and cooperative Resp: clear to auscultation bilaterally Cardio: regular rate and rhythm GI: soft, mild tenderness. Incisions look good. Drain output serosanguinous  Lab Results:  Recent Labs    11/18/23 0441 11/19/23 0613  WBC 23.3* 10.6*  HGB 14.8 12.3*  HCT 45.7 38.3*  PLT 229 210   BMET Recent Labs    11/18/23 0441 11/19/23 0614  NA 134* 135  K 4.0 4.8  CL 99 102  CO2 24 26  GLUCOSE 139* 134*  BUN 23 30*  CREATININE 1.25* 1.35*  CALCIUM 9.1 8.7*   PT/INR No results for input(s): "LABPROT", "INR" in the last 72 hours. ABG No results for input(s): "PHART", "HCO3" in the last 72 hours.  Invalid input(s): "PCO2", "PO2"  Studies/Results: ECHOCARDIOGRAM LIMITED Result Date: 11/18/2023    ECHOCARDIOGRAM LIMITED REPORT   Patient Name:   Kristopher Padilla Date of Exam: 11/18/2023 Medical Rec #:  [id number]    Height:       70.0 in Accession #:    [id number]   Weight:       204.0 lb Date of Birth:  September 11, 1940    BSA:          2.105 m Patient Age:    82 years     BP:           140/58 mmHg Patient Gender: M            HR:           69 bpm. Exam Location:  Inpatient Procedure: 2D Echo, Cardiac Doppler and Color Doppler (Both Spectral and Color            Flow Doppler were utilized during procedure). Indications:    Limited for  LV function, pre-op  History:        Patient has no prior history of Echocardiogram examinations.                 Risk Factors:Hypertension.  Sonographer:    Andrena Bang Referring Phys: [id number] CALLIE E GOODRICH IMPRESSIONS  1. Left ventricular ejection fraction, by estimation, is 65 to 70%. The left ventricle has normal function.  2. Right ventricular systolic function is normal. The right ventricular size is normal.  3. The aortic valve is tricuspid. There is moderate calcification of the aortic valve. Aortic valve regurgitation is trivial. Aortic valve sclerosis is present, with no evidence of aortic valve stenosis. FINDINGS  Left Ventricle: Left ventricular ejection fraction, by estimation, is 65 to 70%. The left ventricle has normal function. The left ventricular internal cavity size was normal in size. There is no left ventricular hypertrophy. Right Ventricle: The right ventricular size is normal. Right vetricular wall thickness was not assessed. Right ventricular systolic function is normal. Pericardium: There is no evidence of pericardial effusion. Aortic Valve: The aortic valve is tricuspid.  There is moderate calcification of the aortic valve. Aortic valve regurgitation is trivial. Aortic valve sclerosis is present, with no evidence of aortic valve stenosis. Aortic valve mean gradient measures 5.0  mmHg. Aortic valve peak gradient measures 8.9 mmHg. Aortic valve area, by VTI measures 3.45 cm. LEFT VENTRICLE PLAX 2D LVOT diam:     2.20 cm LV SV:         101 LV SV Index:   48 LVOT Area:     3.80 cm  LV Volumes (MOD) LV vol d, MOD A2C: 123.0 ml LV vol d, MOD A4C: 112.0 ml LV vol s, MOD A2C: 28.2 ml LV vol s, MOD A4C: 37.4 ml LV SV MOD A2C:     94.8 ml LV SV MOD A4C:     112.0 ml LV SV MOD BP:      85.4 ml AORTIC VALVE AV Area (Vmax):    3.16 cm AV Area (Vmean):   2.89 cm AV Area (VTI):     3.45 cm AV Vmax:           149.00 cm/s AV Vmean:          104.000 cm/s AV VTI:            0.294 m AV Peak Grad:       8.9 mmHg AV Mean Grad:      5.0 mmHg LVOT Vmax:         124.00 cm/s LVOT Vmean:        79.200 cm/s LVOT VTI:          0.267 m LVOT/AV VTI ratio: 0.91  SHUNTS Systemic VTI:  0.27 m Systemic Diam: 2.20 cm Janelle Mediate MD Electronically signed by Janelle Mediate MD Signature Date/Time: 11/18/2023/10:49:04 AM    Final    US  ABDOMEN LIMITED RUQ (LIVER/GB) Result Date: 11/17/2023 CLINICAL DATA:  151471 RUQ pain 151471 EXAM: ULTRASOUND ABDOMEN LIMITED RIGHT UPPER QUADRANT COMPARISON:  June 29, 2023 FINDINGS: Gallbladder: The gallbladder is full of stones and sludge. Mild gallbladder wall thickening measuring 5 mm. No sonographic Murphy's sign noted by sonographer. Common bile duct: Diameter: 3 mm Liver: Normal echogenicity. A few small cysts present measuring up to 2 cm. No intrahepatic biliary ductal dilation. Portal vein is patent on color Doppler imaging with normal direction of blood flow towards the liver. Other: Mild right-sided hydronephrosis. IMPRESSION: 1. Mild right-sided hydronephrosis. 2. The gallbladder is full of stones and biliary sludge. Mild gallbladder wall thickening along the hepatic surface of the gallbladder, measuring 5 mm. In the absence of a sonographic Murphy's sign and pericholecystic fluid, these changes may be related to acute hepatitis, upper abdominal inflammation, or volume overload, either from CHF or renal failure. Laboratory correlation recommended. Electronically Signed   By: Rance Burrows M.D.   On: 11/17/2023 12:09    Anti-infectives: Anti-infectives (From admission, onward)    Start     Dose/Rate Route Frequency Ordered Stop   11/17/23 2000  piperacillin -tazobactam (ZOSYN ) IVPB 3.375 g        3.375 g 12.5 mL/hr over 240 Minutes Intravenous Every 8 hours 11/17/23 1233     11/17/23 1245  piperacillin -tazobactam (ZOSYN ) IVPB 3.375 g        3.375 g 100 mL/hr over 30 Minutes Intravenous  Once 11/17/23 1233 11/17/23 1328       Assessment/Plan: s/p Procedure(s) with  comments: LAPAROSCOPIC CHOLECYSTECTOMY WITH ICG DYE (N/A) - ICG Advance diet Continue drain Continue IV zosyn  Ambulate POD 1  LOS: 1 day  [name] 11/19/2023

## 2023-11-19 NOTE — Plan of Care (Signed)

## 2023-11-19 NOTE — Progress Notes (Signed)
 [facility]                                                                              Kristopher Padilla, is a 83 y.o. male, DOB - 1940/11/23, LKG:[id number] Admit date - 11/17/2023    Outpatient Primary MD for the patient is Kristopher Cables Amiel Kalata, MD  LOS - 1  days  Chief Complaint  Patient presents with   Abdominal Pain       Brief summary   Patient is a 83 year old male with HTN, HLP, aortic atherosclerosis presented to ED with abdominal pain.  Patient reported severe epigastric and right upper quadrant abdominal pain with nausea 5 days prior to admission, Poor appetite and has not had much to eat in the last 2 to 3 days.  He initially thought he may have kidney stones, followed up with urology and underwent outpatient CT renal stone study which showed pericholecystic stranding, punctate nonobstructing bilateral renal calculi without hydronephrosis, multiple bladder stones, no SBO and normal appendix.  Patient was referred to ED. Patient also reported constipation for 3 days with bloating. Patient also reported that he saw Dr. Berry Bristol of cardiology on 11/07/2023 for dizziness and  "jabs in his chest", had event monitor, was scheduled for a future exercise stress test.   Admitted for further workup.  Assessment & Plan   Sepsis with acute cholecystitis -Patient met sepsis criteria on admission with tachycardia, borderline BP, leukocytosis, mild AKI, source likely due to acute cholecystitis -Outpatient CT with pericholecystic stranding, no bowel obstruction and normal appendix. - RUQ ultrasound showed cholelithiasis with mild gallbladder wall thickening of 5 mm - s/p laparoscopic subtotal cholecystectomy with drain placement, on 5/9, postop day #1 - Continue IV Zosyn , pain control, IVF - Awaiting further surgery recommendations regarding if patient will need ERCP/stent - Blood cultures negative so far    ?  Angina equivalent/preop evaluation - Per patient he has been having  jabs in his chest intermittently although no frank chest pain, shortness of breath, DOE.   - Seen by cardiology, 2D echo showed EF of 65 to 70%, normal left ventricular function, right ventricular systolic function normal.      Essential hypertension - Continue IV hydralazine  as needed with parameters - Hold lisinopril due to AKI   Acute kidney injury  -Creatinine 1.35 on admission, creatinine was 1.2 in 06/2023 likely due to poor p.o. intake, lisinopril  - Creatinine trending up to 1.35 again today, will continue IV fluid hydration until patient is back on diet     Hyperglycemia  -Noted glucose 134 on presentation and was 188 on CMP in 06/2023 - HbA1c 5.3   Constipation - Resolved, changed to as needed stool softeners  Obesity class I Estimated body mass index is 29.27 kg/m as calculated from the following:   Height as of this encounter: 5\' 10"  (1.778 m).   Weight as of this encounter: 92.5 kg.  Code Status: Full code DVT Prophylaxis:  SCDs Start: 11/17/23 1754   Level of Care: Level of care: Telemetry Family Communication: Updated patient's wife at the bedside Disposition Plan:      Remains inpatient appropriate:  Awaiting surgery recommendations   Procedures:  5/9 : laparoscopic subtotal cholecystectomy with drain placement   Consultants:   Cardiology Surgery  Antimicrobials:   Anti-infectives (From admission, onward)    Start     Dose/Rate Route Frequency Ordered Stop   11/17/23 2000  piperacillin -tazobactam (ZOSYN ) IVPB 3.375 g        3.375 g 12.5 mL/hr over 240 Minutes Intravenous Every 8 hours 11/17/23 1233     11/17/23 1245  piperacillin -tazobactam (ZOSYN ) IVPB 3.375 g        3.375 g 100 mL/hr over 30 Minutes Intravenous  Once 11/17/23 1233 11/17/23 1328          Medications  famotidine   20 mg Oral QHS   latanoprost   1 drop Both Eyes QHS   tamsulosin   0.4 mg Oral QPC breakfast      Subjective:   Solly Damme was seen and examined today.   Still having some abdominal pain, s/p subtotal cholecystectomy with drain placement.  Again n.p.o. this morning, no nausea vomiting, chest pain or shortness of breath.    Objective:   Vitals:   11/18/23 2206 11/19/23 0120 11/19/23 0617 11/19/23 0919  BP: (!) 117/48 (!) 126/51 (!) 127/56 (!) 150/62  Pulse: 66 67 (!) 56 63  Resp: 19 16 18 18   Temp: 97.6 F (36.4 C) 98 F (36.7 C) (!) 97.5 F (36.4 C)   TempSrc: Oral Oral Oral   SpO2: 93% 95% 96% 93%  Weight:      Height:        Intake/Output Summary (Last 24 hours) at 11/19/2023 1059 Last data filed at 11/19/2023 1610 Gross per 24 hour  Intake 166.04 ml  Output 180 ml  Net -13.96 ml     Wt Readings from Last 3 Encounters:  11/18/23 92.5 kg  11/07/23 96.2 kg  10/13/19 97.5 kg    Physical Exam General: Alert and oriented x 3, NAD Cardiovascular: S1 S2 clear, RRR.  Respiratory: CTAB, no wheezing Gastrointestinal: Soft, appropriately tender, incisions CDI mildly distended, NBS, drain+ Ext: no pedal edema bilaterally Neuro: no new deficits Psych: Normal affect     Data Reviewed:  I have personally reviewed following labs    CBC Lab Results  Component Value Date   WBC 10.6 (H) 11/19/2023   RBC 4.08 (L) 11/19/2023   HGB 12.3 (L) 11/19/2023   HCT 38.3 (L) 11/19/2023   MCV 93.9 11/19/2023   MCH 30.1 11/19/2023   PLT 210 11/19/2023   MCHC 32.1 11/19/2023   RDW 12.4 11/19/2023   LYMPHSABS 0.9 06/15/2013   MONOABS 0.6 06/15/2013   EOSABS 0.1 06/15/2013   BASOSABS 0.0 06/15/2013     Last metabolic panel Lab Results  Component Value Date   NA 135 11/19/2023   K 4.8 11/19/2023   CL 102 11/19/2023   CO2 26 11/19/2023   BUN 30 (H) 11/19/2023   CREATININE 1.35 (H) 11/19/2023   GLUCOSE 134 (H) 11/19/2023   GFRNONAA 53 (L) 11/19/2023   GFRAA >60 10/13/2019   CALCIUM 8.7 (L) 11/19/2023   PHOS 3.7 11/19/2023   PROT 5.8 (L) 11/19/2023   ALBUMIN 2.7 (L) 11/19/2023   BILITOT 1.1 11/19/2023   ALKPHOS 41  11/19/2023   AST 49 (H) 11/19/2023   ALT 55 (H) 11/19/2023   ANIONGAP 7 11/19/2023    CBG (last 3)  No results for input(s): "GLUCAP" in the last 72 hours.    Coagulation Profile: No results for input(s): "INR", "PROTIME" in the last 168 hours.  Radiology Studies: I have personally reviewed the imaging studies  ECHOCARDIOGRAM LIMITED Result Date: 11/18/2023    ECHOCARDIOGRAM LIMITED REPORT   Patient Name:   COLE FURRH Date of Exam: 11/18/2023 Medical Rec #:  [id number]    Height:       70.0 in Accession #:    [id number]   Weight:       204.0 lb Date of Birth:  Aug 26, 1940    BSA:          2.105 m Patient Age:    82 years     BP:           140/58 mmHg Patient Gender: M            HR:           69 bpm. Exam Location:  Inpatient Procedure: 2D Echo, Cardiac Doppler and Color Doppler (Both Spectral and Color            Flow Doppler were utilized during procedure). Indications:    Limited for LV function, pre-op  History:        Patient has no prior history of Echocardiogram examinations.                 Risk Factors:Hypertension.  Sonographer:    Andrena Bang Referring Phys: [id number] CALLIE E GOODRICH IMPRESSIONS  1. Left ventricular ejection fraction, by estimation, is 65 to 70%. The left ventricle has normal function.  2. Right ventricular systolic function is normal. The right ventricular size is normal.  3. The aortic valve is tricuspid. There is moderate calcification of the aortic valve. Aortic valve regurgitation is trivial. Aortic valve sclerosis is present, with no evidence of aortic valve stenosis. FINDINGS  Left Ventricle: Left ventricular ejection fraction, by estimation, is 65 to 70%. The left ventricle has normal function. The left ventricular internal cavity size was normal in size. There is no left ventricular hypertrophy. Right Ventricle: The right ventricular size is normal. Right vetricular wall thickness was not assessed. Right ventricular systolic function is normal. Pericardium: There is  no evidence of pericardial effusion. Aortic Valve: The aortic valve is tricuspid. There is moderate calcification of the aortic valve. Aortic valve regurgitation is trivial. Aortic valve sclerosis is present, with no evidence of aortic valve stenosis. Aortic valve mean gradient measures 5.0  mmHg. Aortic valve peak gradient measures 8.9 mmHg. Aortic valve area, by VTI measures 3.45 cm. LEFT VENTRICLE PLAX 2D LVOT diam:     2.20 cm LV SV:         101 LV SV Index:   48 LVOT Area:     3.80 cm  LV Volumes (MOD) LV vol d, MOD A2C: 123.0 ml LV vol d, MOD A4C: 112.0 ml LV vol s, MOD A2C: 28.2 ml LV vol s, MOD A4C: 37.4 ml LV SV MOD A2C:     94.8 ml LV SV MOD A4C:     112.0 ml LV SV MOD BP:      85.4 ml AORTIC VALVE AV Area (Vmax):    3.16 cm AV Area (Vmean):   2.89 cm AV Area (VTI):     3.45 cm AV Vmax:           149.00 cm/s AV Vmean:          104.000 cm/s AV VTI:            0.294 m AV Peak Grad:      8.9 mmHg AV Mean Grad:  5.0 mmHg LVOT Vmax:         124.00 cm/s LVOT Vmean:        79.200 cm/s LVOT VTI:          0.267 m LVOT/AV VTI ratio: 0.91  SHUNTS Systemic VTI:  0.27 m Systemic Diam: 2.20 cm Janelle Mediate MD Electronically signed by Janelle Mediate MD Signature Date/Time: 11/18/2023/10:49:04 AM    Final    US  ABDOMEN LIMITED RUQ (LIVER/GB) Result Date: 11/17/2023 CLINICAL DATA:  151471 RUQ pain 151471 EXAM: ULTRASOUND ABDOMEN LIMITED RIGHT UPPER QUADRANT COMPARISON:  June 29, 2023 FINDINGS: Gallbladder: The gallbladder is full of stones and sludge. Mild gallbladder wall thickening measuring 5 mm. No sonographic Murphy's sign noted by sonographer. Common bile duct: Diameter: 3 mm Liver: Normal echogenicity. A few small cysts present measuring up to 2 cm. No intrahepatic biliary ductal dilation. Portal vein is patent on color Doppler imaging with normal direction of blood flow towards the liver. Other: Mild right-sided hydronephrosis. IMPRESSION: 1. Mild right-sided hydronephrosis. 2. The gallbladder is full of  stones and biliary sludge. Mild gallbladder wall thickening along the hepatic surface of the gallbladder, measuring 5 mm. In the absence of a sonographic Murphy's sign and pericholecystic fluid, these changes may be related to acute hepatitis, upper abdominal inflammation, or volume overload, either from CHF or renal failure. Laboratory correlation recommended. Electronically Signed   By: Rance Burrows M.D.   On: 11/17/2023 12:09       Aylen Stradford M.D. [facility] 11/19/2023, 10:59 AM  Available via Epic secure chat 7am-7pm After 7 pm, please refer to night coverage provider listed on amion.

## 2023-11-19 NOTE — Progress Notes (Addendum)
 Rounding Note    Patient Name: Kristopher Padilla Date of Encounter: 11/19/2023  [facility] Cardiologist: Knox Perl, MD   Subjective   No CP  Breathing is OK  Inpatient Medications    Scheduled Meds:  famotidine   20 mg Oral QHS   latanoprost   1 drop Both Eyes QHS   tamsulosin   0.4 mg Oral QPC breakfast   Continuous Infusions:  lactated ringers  100 mL/hr at 11/19/23 1220   piperacillin -tazobactam (ZOSYN )  IV 3.375 g (11/19/23 1217)   PRN Meds: acetaminophen  **OR** acetaminophen , alum & mag hydroxide-simeth, hydrALAZINE , HYDROmorphone  (DILAUDID ) injection, ondansetron  **OR** ondansetron  (ZOFRAN ) IV, oxyCODONE , polyethylene glycol   Vital Signs    Vitals:   11/18/23 2206 11/19/23 0120 11/19/23 0617 11/19/23 0919  BP: (!) 117/48 (!) 126/51 (!) 127/56 (!) 150/62  Pulse: 66 67 (!) 56 63  Resp: 19 16 18 18   Temp: 97.6 F (36.4 C) 98 F (36.7 C) (!) 97.5 F (36.4 C)   TempSrc: Oral Oral Oral   SpO2: 93% 95% 96% 93%  Weight:      Height:        Intake/Output Summary (Last 24 hours) at 11/19/2023 1329 Last data filed at 11/19/2023 4098 Gross per 24 hour  Intake 166.04 ml  Output 180 ml  Net -13.96 ml      11/18/2023    1:13 PM 11/18/2023    9:11 AM 11/17/2023    9:44 AM  Last 3 Weights  Weight (lbs) 204 lb 204 lb 204 lb  Weight (kg) 92.534 kg 92.534 kg 92.534 kg      Telemetry    SR - Personally Reviewed  ECG    No new  - Personally Reviewed  Physical Exam   GEN: Obese 83 yo in no acute distress.   Neck: No JVD Cardiac: RRR, no murmurs, Respiratory: Clear to auscultation MS: No LE  edema;  Labs    High Sensitivity Troponin:  No results for input(s): "TROPONINIHS" in the last 720 hours.   Chemistry Recent Labs  Lab 11/17/23 [phone] 11/18/23 0441 11/19/23 0613 11/19/23 0614  NA 135 134*  --  135  K 4.3 4.0  --  4.8  CL 98 99  --  102  CO2 23 24  --  26  GLUCOSE 134* 139*  --  134*  BUN 18 23  --  30*  CREATININE 1.35* 1.25*  --  1.35*   CALCIUM 9.7 9.1  --  8.7*  PROT 7.2 6.7 5.8*  --   ALBUMIN 4.0 3.4* 2.8* 2.7*  AST 23 20 49*  --   ALT 16 19 55*  --   ALKPHOS 51 43 41  --   BILITOT 1.1 1.3* 1.1  --   GFRNONAA 53* 58*  --  53*  ANIONGAP 14 11  --  7    Lipids No results for input(s): "CHOL", "TRIG", "HDL", "LABVLDL", "LDLCALC", "CHOLHDL" in the last 168 hours.  Hematology Recent Labs  Lab 11/17/23 0946 11/18/23 0441 11/19/23 0613  WBC 23.5* 23.3* 10.6*  RBC 5.27 4.86 4.08*  HGB 16.0 14.8 12.3*  HCT 46.7 45.7 38.3*  MCV 88.6 94.0 93.9  MCH 30.4 30.5 30.1  MCHC 34.3 32.4 32.1  RDW 12.3 12.3 12.4  PLT 215 229 210   Thyroid  No results for input(s): "TSH", "FREET4" in the last 168 hours.  BNPNo results for input(s): "BNP", "PROBNP" in the last 168 hours.  DDimer No results for input(s): "DDIMER" in the last 168  hours.   Radiology    ECHOCARDIOGRAM LIMITED Result Date: 11/18/2023    ECHOCARDIOGRAM LIMITED REPORT   Patient Name:   Kristopher Padilla Date of Exam: 11/18/2023 Medical Rec #:  [id number]    Height:       70.0 in Accession #:    [id number]   Weight:       204.0 lb Date of Birth:  04-Aug-1940    BSA:          2.105 m Patient Age:    83 years     BP:           140/58 mmHg Patient Gender: M            HR:           69 bpm. Exam Location:  Inpatient Procedure: 2D Echo, Cardiac Doppler and Color Doppler (Both Spectral and Color            Flow Doppler were utilized during procedure). Indications:    Limited for LV function, pre-op  History:        Patient has no prior history of Echocardiogram examinations.                 Risk Factors:Hypertension.  Sonographer:    Andrena Bang Referring Phys: [id number] CALLIE E GOODRICH IMPRESSIONS  1. Left ventricular ejection fraction, by estimation, is 65 to 70%. The left ventricle has normal function.  2. Right ventricular systolic function is normal. The right ventricular size is normal.  3. The aortic valve is tricuspid. There is moderate calcification of the aortic valve. Aortic valve  regurgitation is trivial. Aortic valve sclerosis is present, with no evidence of aortic valve stenosis. FINDINGS  Left Ventricle: Left ventricular ejection fraction, by estimation, is 65 to 70%. The left ventricle has normal function. The left ventricular internal cavity size was normal in size. There is no left ventricular hypertrophy. Right Ventricle: The right ventricular size is normal. Right vetricular wall thickness was not assessed. Right ventricular systolic function is normal. Pericardium: There is no evidence of pericardial effusion. Aortic Valve: The aortic valve is tricuspid. There is moderate calcification of the aortic valve. Aortic valve regurgitation is trivial. Aortic valve sclerosis is present, with no evidence of aortic valve stenosis. Aortic valve mean gradient measures 5.0  mmHg. Aortic valve peak gradient measures 8.9 mmHg. Aortic valve area, by VTI measures 3.45 cm. LEFT VENTRICLE PLAX 2D LVOT diam:     2.20 cm LV SV:         101 LV SV Index:   48 LVOT Area:     3.80 cm  LV Volumes (MOD) LV vol d, MOD A2C: 123.0 ml LV vol d, MOD A4C: 112.0 ml LV vol s, MOD A2C: 28.2 ml LV vol s, MOD A4C: 37.4 ml LV SV MOD A2C:     94.8 ml LV SV MOD A4C:     112.0 ml LV SV MOD BP:      85.4 ml AORTIC VALVE AV Area (Vmax):    3.16 cm AV Area (Vmean):   2.89 cm AV Area (VTI):     3.45 cm AV Vmax:           149.00 cm/s AV Vmean:          104.000 cm/s AV VTI:            0.294 m AV Peak Grad:      8.9 mmHg AV Mean Grad:      5.0 mmHg  LVOT Vmax:         124.00 cm/s LVOT Vmean:        79.200 cm/s LVOT VTI:          0.267 m LVOT/AV VTI ratio: 0.91  SHUNTS Systemic VTI:  0.27 m Systemic Diam: 2.20 cm Janelle Mediate MD Electronically signed by Janelle Mediate MD Signature Date/Time: 11/18/2023/10:49:04 AM    Final     Cardiac Studies   Echo  11/18/23  1. Left ventricular ejection fraction, by estimation, is 65 to 70%. The  left ventricle has normal function.   2. Right ventricular systolic function is normal. The  right ventricular  size is normal.   3. The aortic valve is tricuspid. There is moderate calcification of the  aortic valve. Aortic valve regurgitation is trivial. Aortic valve  sclerosis is present, with no evidence of aortic valve stenosis.    Patient Profile      Kristopher Padilla is a 83 y.o. male with a history of atypical chest pain, hypertension, hyperlipidemia, CKD stage IIIa who is being seen 11/18/2023 for preop evaluation at the request of Dr. Randal Bury.    Assessment & Plan    Pt doing OK from cardiac standpoint   Tolerated procedure well yesterday  He has a monitor on for intermitt bradycardia  This can be mailed in when done   He was on lisinopril 5 mg for HTN prior to admit   This can be restarted when taking PO and BP elevated      Can use IV meds as needed for now    Will sign off   Please call with questions   For questions or updates, please contact [facility] Please consult [email or web address] for contact info under        Signed, Ola Berger, MD  11/19/2023, 1:29 PM

## 2023-11-20 DIAGNOSIS — E7800 Pure hypercholesterolemia, unspecified: Secondary | ICD-10-CM | POA: Diagnosis not present

## 2023-11-20 DIAGNOSIS — K8000 Calculus of gallbladder with acute cholecystitis without obstruction: Secondary | ICD-10-CM | POA: Diagnosis not present

## 2023-11-20 DIAGNOSIS — R739 Hyperglycemia, unspecified: Secondary | ICD-10-CM | POA: Diagnosis not present

## 2023-11-20 DIAGNOSIS — I10 Essential (primary) hypertension: Secondary | ICD-10-CM | POA: Diagnosis not present

## 2023-11-20 LAB — RENAL FUNCTION PANEL
Albumin: 2.8 g/dL — ABNORMAL LOW (ref 3.5–5.0)
Anion gap: 8 (ref 5–15)
BUN: 27 mg/dL — ABNORMAL HIGH (ref 8–23)
CO2: 25 mmol/L (ref 22–32)
Calcium: 8.9 mg/dL (ref 8.9–10.3)
Chloride: 105 mmol/L (ref 98–111)
Creatinine, Ser: 1.3 mg/dL — ABNORMAL HIGH (ref 0.61–1.24)
GFR, Estimated: 56 mL/min — ABNORMAL LOW (ref >60)
Glucose, Bld: 106 mg/dL — ABNORMAL HIGH (ref 70–99)
Phosphorus: 2.5 mg/dL (ref 2.5–4.6)
Potassium: 3.7 mmol/L (ref 3.5–5.1)
Sodium: 138 mmol/L (ref 135–145)

## 2023-11-20 LAB — CBC
HCT: 41 % (ref 39.0–52.0)
Hemoglobin: 12.8 g/dL — ABNORMAL LOW (ref 13.0–17.0)
MCH: 30.3 pg (ref 26.0–34.0)
MCHC: 31.2 g/dL (ref 30.0–36.0)
MCV: 96.9 fL (ref 80.0–100.0)
Platelets: 249 10*3/uL (ref 150–400)
RBC: 4.23 MIL/uL (ref 4.22–5.81)
RDW: 12.5 % (ref 11.5–15.5)
WBC: 9.3 10*3/uL (ref 4.0–10.5)
nRBC: 0 % (ref 0.0–0.2)

## 2023-11-20 LAB — HEPATIC FUNCTION PANEL
ALT: 122 U/L — ABNORMAL HIGH (ref 0–44)
AST: 86 U/L — ABNORMAL HIGH (ref 15–41)
Albumin: 2.8 g/dL — ABNORMAL LOW (ref 3.5–5.0)
Alkaline Phosphatase: 42 U/L (ref 38–126)
Bilirubin, Direct: 0.1 mg/dL (ref 0.0–0.2)
Indirect Bilirubin: 1 mg/dL — ABNORMAL HIGH (ref 0.3–0.9)
Total Bilirubin: 1.1 mg/dL (ref 0.0–1.2)
Total Protein: 5.8 g/dL — ABNORMAL LOW (ref 6.5–8.1)

## 2023-11-20 NOTE — Plan of Care (Signed)

## 2023-11-20 NOTE — Progress Notes (Signed)
 [facility]                                                                              Kristopher Padilla, is a 83 y.o. male, DOB - 1941-04-30, ZOX:[id number] Admit date - 11/17/2023    Outpatient Primary MD for the patient is [name], MD  LOS - 2  days  Chief Complaint  Patient presents with   Abdominal Pain       Brief summary   Patient is a 83 year old male with HTN, HLP, aortic atherosclerosis presented to ED with abdominal pain.  Patient reported severe epigastric and right upper quadrant abdominal pain with nausea 5 days prior to admission, Poor appetite and has not had much to eat in the last 2 to 3 days.  He initially thought he may have kidney stones, followed up with urology and underwent outpatient CT renal stone study which showed pericholecystic stranding, punctate nonobstructing bilateral renal calculi without hydronephrosis, multiple bladder stones, no SBO and normal appendix.  Patient was referred to ED. Patient also reported constipation for 3 days with bloating. Patient also reported that he saw Dr. Berry Bristol of cardiology on 11/07/2023 for dizziness and  "jabs in his chest", had event monitor, was scheduled for a future exercise stress test.   Admitted for further workup.  Assessment & Plan   Sepsis with acute cholecystitis -Patient met sepsis criteria on admission with tachycardia, borderline BP, leukocytosis, mild AKI, source likely due to acute cholecystitis -Outpatient CT with pericholecystic stranding, no bowel obstruction and normal appendix. - RUQ US  showed cholelithiasis with mild gallbladder wall thickening of 5 mm - s/p laparoscopic subtotal cholecystectomy with drain placement, on 5/9, postop day # 2 - Continue IV Zosyn , pain control.   - Discussed with surgery, Dr. Alethea Andes, will continue drain - LFTs worsened today, will follow surgery recommendations - Tolerating diet, counseled patient to ambulate to increase mobility   ?  Angina  equivalent/preop evaluation - Per patient he has been having jabs in his chest intermittently although no frank chest pain, shortness of breath, DOE.   - Seen by cardiology, 2D echo showed EF of 65 to 70%, normal left ventricular function, right ventricular systolic function normal.      Essential hypertension - Continue IV hydralazine  as needed with parameters - Hold lisinopril due to AKI   Acute kidney injury  -Creatinine 1.35 on admission, creatinine was 1.2 in 06/2023 likely due to poor p.o. intake, lisinopril  - Creatinine plateaued at 1.3, continue to follow, hold lisinopril      Hyperglycemia  -Noted glucose 134 on presentation and was 188 on CMP in 06/2023 - HbA1c 5.3   Constipation - Resolved, changed to as needed stool softeners  Obesity class I Estimated body mass index is 29.27 kg/m as calculated from the following:   Height as of this encounter: 5\' 10"  (1.778 m).   Weight as of this encounter: 92.5 kg.  Code Status: Full code DVT Prophylaxis:  SCDs Start: 11/17/23 1754   Level of Care: Level of care: Telemetry Family Communication: Updated patient Disposition Plan:      Remains inpatient appropriate:  Procedures:  5/9 : laparoscopic subtotal cholecystectomy with drain placement   Consultants:   Cardiology Surgery  Antimicrobials:   Anti-infectives (From admission, onward)    Start     Dose/Rate Route Frequency Ordered Stop   11/17/23 2000  piperacillin -tazobactam (ZOSYN ) IVPB 3.375 g        3.375 g 12.5 mL/hr over 240 Minutes Intravenous Every 8 hours 11/17/23 1233     11/17/23 1245  piperacillin -tazobactam (ZOSYN ) IVPB 3.375 g        3.375 g 100 mL/hr over 30 Minutes Intravenous  Once 11/17/23 1233 11/17/23 1328          Medications  famotidine   20 mg Oral QHS   latanoprost   1 drop Both Eyes QHS   tamsulosin   0.4 mg Oral QPC breakfast      Subjective:   Kristopher Padilla was seen and examined today.  No acute complaints, LFTs trending up  today.  No nausea vomiting, chest pain or shortness of breath, no fevers   Objective:   Vitals:   11/19/23 0919 11/19/23 1332 11/19/23 2159 11/20/23 0609  BP: (!) 150/62 (!) 146/61 (!) 181/69 (!) 162/70  Pulse: 63 72 62 (!) 59  Resp: 18  18 18   Temp:  98.3 F (36.8 C) 98.2 F (36.8 C) 98.2 F (36.8 C)  TempSrc:  Oral Oral Oral  SpO2: 93% 94% 98% 93%  Weight:      Height:        Intake/Output Summary (Last 24 hours) at 11/20/2023 1136 Last data filed at 11/20/2023 0949 Gross per 24 hour  Intake 1674.05 ml  Output 245 ml  Net 1429.05 ml     Wt Readings from Last 3 Encounters:  11/18/23 92.5 kg  11/07/23 96.2 kg  10/13/19 97.5 kg   Physical Exam General: Alert and oriented x 3, NAD Cardiovascular: S1 S2 clear, RRR.  Respiratory: CTAB, no wheezing, rales or rhonchi Gastrointestinal: Soft, appropriately tender, mildly distended, NBS, drain+ Ext: no pedal edema bilaterally Neuro: no new deficits Psych: Normal affect     Data Reviewed:  I have personally reviewed following labs    CBC Lab Results  Component Value Date   WBC 9.3 11/20/2023   RBC 4.23 11/20/2023   HGB 12.8 (L) 11/20/2023   HCT 41.0 11/20/2023   MCV 96.9 11/20/2023   MCH 30.3 11/20/2023   PLT 249 11/20/2023   MCHC 31.2 11/20/2023   RDW 12.5 11/20/2023   LYMPHSABS 0.9 06/15/2013   MONOABS 0.6 06/15/2013   EOSABS 0.1 06/15/2013   BASOSABS 0.0 06/15/2013     Last metabolic panel Lab Results  Component Value Date   NA 138 11/20/2023   K 3.7 11/20/2023   CL 105 11/20/2023   CO2 25 11/20/2023   BUN 27 (H) 11/20/2023   CREATININE 1.30 (H) 11/20/2023   GLUCOSE 106 (H) 11/20/2023   GFRNONAA 56 (L) 11/20/2023   GFRAA >60 10/13/2019   CALCIUM 8.9 11/20/2023   PHOS 2.5 11/20/2023   PROT 5.8 (L) 11/20/2023   ALBUMIN 2.8 (L) 11/20/2023   ALBUMIN 2.8 (L) 11/20/2023   BILITOT 1.1 11/20/2023   ALKPHOS 42 11/20/2023   AST 86 (H) 11/20/2023   ALT 122 (H) 11/20/2023   ANIONGAP 8 11/20/2023     CBG (last 3)  No results for input(s): "GLUCAP" in the last 72 hours.    Coagulation Profile: No results for input(s): "INR", "PROTIME" in the last 168 hours.   Radiology Studies: I have personally reviewed the imaging studies  No results found.      Bertram Brocks M.D. [facility] 11/20/2023, 11:36 AM  Available via Epic secure chat 7am-7pm After 7 pm, please refer to night coverage provider listed on amion.

## 2023-11-20 NOTE — Progress Notes (Signed)
 2 Days Post-Op   Subjective/Chief Complaint: Complains of soreness. Otherwise ok   Objective: Vital signs in last 24 hours: Temp:  [98.2 F (36.8 C)-98.3 F (36.8 C)] 98.2 F (36.8 C) (05/11 0609) Pulse Rate:  [59-72] 59 (05/11 0609) Resp:  [18] 18 (05/11 0609) BP: (146-181)/(61-70) 162/70 (05/11 0609) SpO2:  [93 %-98 %] 93 % (05/11 0609) Last BM Date : 11/18/23  Intake/Output from previous day: 05/10 0701 - 05/11 0700 In: 1674.1 [P.O.:480; I.V.:1000; IV Piggyback:194.1] Out: 185 [Drains:185] Intake/Output this shift: Total I/O In: -  Out: 30 [Drains:30]  General appearance: alert and cooperative Resp: clear to auscultation bilaterally Cardio: regular rate and rhythm GI: soft, just focal tenderness RUQ. Drain output serosanguinous  Lab Results:  Recent Labs    11/19/23 0613 11/20/23 0550  WBC 10.6* 9.3  HGB 12.3* 12.8*  HCT 38.3* 41.0  PLT 210 249   BMET Recent Labs    11/19/23 0614 11/20/23 0550  NA 135 138  K 4.8 3.7  CL 102 105  CO2 26 25  GLUCOSE 134* 106*  BUN 30* 27*  CREATININE 1.35* 1.30*  CALCIUM 8.7* 8.9   PT/INR No results for input(s): "LABPROT", "INR" in the last 72 hours. ABG No results for input(s): "PHART", "HCO3" in the last 72 hours.  Invalid input(s): "PCO2", "PO2"  Studies/Results: ECHOCARDIOGRAM LIMITED Result Date: 11/18/2023    ECHOCARDIOGRAM LIMITED REPORT   Patient Name:   Kristopher Padilla Date of Exam: 11/18/2023 Medical Rec #:  [id number]    Height:       70.0 in Accession #:    [id number]   Weight:       204.0 lb Date of Birth:  08-19-1940    BSA:          2.105 m Patient Age:    82 years     BP:           140/58 mmHg Patient Gender: M            HR:           69 bpm. Exam Location:  Inpatient Procedure: 2D Echo, Cardiac Doppler and Color Doppler (Both Spectral and Color            Flow Doppler were utilized during procedure). Indications:    Limited for LV function, pre-op  History:        Patient has no prior history of  Echocardiogram examinations.                 Risk Factors:Hypertension.  Sonographer:    Andrena Bang Referring Phys: [id number] CALLIE E GOODRICH IMPRESSIONS  1. Left ventricular ejection fraction, by estimation, is 65 to 70%. The left ventricle has normal function.  2. Right ventricular systolic function is normal. The right ventricular size is normal.  3. The aortic valve is tricuspid. There is moderate calcification of the aortic valve. Aortic valve regurgitation is trivial. Aortic valve sclerosis is present, with no evidence of aortic valve stenosis. FINDINGS  Left Ventricle: Left ventricular ejection fraction, by estimation, is 65 to 70%. The left ventricle has normal function. The left ventricular internal cavity size was normal in size. There is no left ventricular hypertrophy. Right Ventricle: The right ventricular size is normal. Right vetricular wall thickness was not assessed. Right ventricular systolic function is normal. Pericardium: There is no evidence of pericardial effusion. Aortic Valve: The aortic valve is tricuspid. There is moderate calcification of the aortic valve. Aortic valve regurgitation is trivial. Aortic  valve sclerosis is present, with no evidence of aortic valve stenosis. Aortic valve mean gradient measures 5.0  mmHg. Aortic valve peak gradient measures 8.9 mmHg. Aortic valve area, by VTI measures 3.45 cm. LEFT VENTRICLE PLAX 2D LVOT diam:     2.20 cm LV SV:         101 LV SV Index:   48 LVOT Area:     3.80 cm  LV Volumes (MOD) LV vol d, MOD A2C: 123.0 ml LV vol d, MOD A4C: 112.0 ml LV vol s, MOD A2C: 28.2 ml LV vol s, MOD A4C: 37.4 ml LV SV MOD A2C:     94.8 ml LV SV MOD A4C:     112.0 ml LV SV MOD BP:      85.4 ml AORTIC VALVE AV Area (Vmax):    3.16 cm AV Area (Vmean):   2.89 cm AV Area (VTI):     3.45 cm AV Vmax:           149.00 cm/s AV Vmean:          104.000 cm/s AV VTI:            0.294 m AV Peak Grad:      8.9 mmHg AV Mean Grad:      5.0 mmHg LVOT Vmax:         124.00 cm/s  LVOT Vmean:        79.200 cm/s LVOT VTI:          0.267 m LVOT/AV VTI ratio: 0.91  SHUNTS Systemic VTI:  0.27 m Systemic Diam: 2.20 cm Janelle Mediate MD Electronically signed by Janelle Mediate MD Signature Date/Time: 11/18/2023/10:49:04 AM    Final     Anti-infectives: Anti-infectives (From admission, onward)    Start     Dose/Rate Route Frequency Ordered Stop   11/17/23 2000  piperacillin -tazobactam (ZOSYN ) IVPB 3.375 g        3.375 g 12.5 mL/hr over 240 Minutes Intravenous Every 8 hours 11/17/23 1233     11/17/23 1245  piperacillin -tazobactam (ZOSYN ) IVPB 3.375 g        3.375 g 100 mL/hr over 30 Minutes Intravenous  Once 11/17/23 1233 11/17/23 1328       Assessment/Plan: s/p Procedure(s) with comments: LAPAROSCOPIC CHOLECYSTECTOMY WITH ICG DYE (N/A) - ICG Advance diet Continue drain and abx Ambulate POD 2  LOS: 2 days    [name] 11/20/2023

## 2023-11-20 NOTE — Anesthesia Postprocedure Evaluation (Signed)
 Anesthesia Post Note  Patient: Kristopher Padilla  Procedure(s) Performed: LAPAROSCOPIC CHOLECYSTECTOMY WITH ICG DYE     Patient location during evaluation: PACU Anesthesia Type: General Level of consciousness: sedated and patient cooperative Pain management: pain level controlled Vital Signs Assessment: post-procedure vital signs reviewed and stable Respiratory status: spontaneous breathing Cardiovascular status: stable Anesthetic complications: no   No notable events documented.  Last Vitals:  Vitals:   11/20/23 0609 11/20/23 1316  BP: (!) 162/70 (!) 155/60  Pulse: (!) 59 64  Resp: 18 15  Temp: 36.8 C (!) 36.4 C  SpO2: 93% 98%    Last Pain:  Vitals:   11/20/23 1247  TempSrc:   PainSc: 5                  Gorman Laughter

## 2023-11-21 ENCOUNTER — Other Ambulatory Visit (HOSPITAL_COMMUNITY): Payer: Self-pay

## 2023-11-21 ENCOUNTER — Other Ambulatory Visit: Payer: Self-pay

## 2023-11-21 ENCOUNTER — Encounter (HOSPITAL_COMMUNITY): Payer: Self-pay | Admitting: General Surgery

## 2023-11-21 DIAGNOSIS — K8000 Calculus of gallbladder with acute cholecystitis without obstruction: Secondary | ICD-10-CM | POA: Diagnosis not present

## 2023-11-21 DIAGNOSIS — I10 Essential (primary) hypertension: Secondary | ICD-10-CM | POA: Diagnosis not present

## 2023-11-21 DIAGNOSIS — R739 Hyperglycemia, unspecified: Secondary | ICD-10-CM | POA: Diagnosis not present

## 2023-11-21 DIAGNOSIS — E7800 Pure hypercholesterolemia, unspecified: Secondary | ICD-10-CM | POA: Diagnosis not present

## 2023-11-21 LAB — CBC
HCT: 40.8 % (ref 39.0–52.0)
Hemoglobin: 12.9 g/dL — ABNORMAL LOW (ref 13.0–17.0)
MCH: 30.4 pg (ref 26.0–34.0)
MCHC: 31.6 g/dL (ref 30.0–36.0)
MCV: 96 fL (ref 80.0–100.0)
Platelets: 257 10*3/uL (ref 150–400)
RBC: 4.25 MIL/uL (ref 4.22–5.81)
RDW: 12.7 % (ref 11.5–15.5)
WBC: 6.6 10*3/uL (ref 4.0–10.5)
nRBC: 0 % (ref 0.0–0.2)

## 2023-11-21 LAB — RENAL FUNCTION PANEL
Albumin: 2.7 g/dL — ABNORMAL LOW (ref 3.5–5.0)
Anion gap: 9 (ref 5–15)
BUN: 24 mg/dL — ABNORMAL HIGH (ref 8–23)
CO2: 27 mmol/L (ref 22–32)
Calcium: 8.6 mg/dL — ABNORMAL LOW (ref 8.9–10.3)
Chloride: 100 mmol/L (ref 98–111)
Creatinine, Ser: 1.29 mg/dL — ABNORMAL HIGH (ref 0.61–1.24)
GFR, Estimated: 56 mL/min — ABNORMAL LOW (ref >60)
Glucose, Bld: 99 mg/dL (ref 70–99)
Phosphorus: 4 mg/dL (ref 2.5–4.6)
Potassium: 4 mmol/L (ref 3.5–5.1)
Sodium: 136 mmol/L (ref 135–145)

## 2023-11-21 LAB — HEPATIC FUNCTION PANEL
ALT: 110 U/L — ABNORMAL HIGH (ref 0–44)
AST: 52 U/L — ABNORMAL HIGH (ref 15–41)
Albumin: 2.7 g/dL — ABNORMAL LOW (ref 3.5–5.0)
Alkaline Phosphatase: 37 U/L — ABNORMAL LOW (ref 38–126)
Bilirubin, Direct: 0.1 mg/dL (ref 0.0–0.2)
Indirect Bilirubin: 0.7 mg/dL (ref 0.3–0.9)
Total Bilirubin: 0.8 mg/dL (ref 0.0–1.2)
Total Protein: 5.6 g/dL — ABNORMAL LOW (ref 6.5–8.1)

## 2023-11-21 MED ORDER — SENNOSIDES-DOCUSATE SODIUM 8.6-50 MG PO TABS
1.0000 | ORAL_TABLET | Freq: Two times a day (BID) | ORAL | 0 refills | Status: AC
  Filled 2023-11-21: qty 60, 30d supply, fill #0

## 2023-11-21 MED ORDER — POLYETHYLENE GLYCOL 3350 17 GM/SCOOP PO POWD
17.0000 g | Freq: Every day | ORAL | 3 refills | Status: AC | PRN
  Filled 2023-11-21: qty 476, 28d supply, fill #0

## 2023-11-21 MED ORDER — ONDANSETRON 4 MG PO TBDP
4.0000 mg | ORAL_TABLET | Freq: Three times a day (TID) | ORAL | 0 refills | Status: DC | PRN
  Filled 2023-11-21: qty 20, 7d supply, fill #0

## 2023-11-21 MED ORDER — OXYCODONE HCL 5 MG PO TABS
5.0000 mg | ORAL_TABLET | ORAL | 0 refills | Status: DC | PRN
  Filled 2023-11-21: qty 15, 2d supply, fill #0

## 2023-11-21 MED ORDER — FAMOTIDINE 20 MG PO TABS
20.0000 mg | ORAL_TABLET | Freq: Every day | ORAL | 3 refills | Status: AC
  Filled 2023-11-21: qty 30, 30d supply, fill #0

## 2023-11-21 NOTE — Progress Notes (Signed)
   Progress Note  3 Days Post-Op  Subjective: Tolerating diet and pain well controlled. Wife at bedside. Drain is SS.   Objective: Vital signs in last 24 hours: Temp:  [97.5 F (36.4 C)-98.1 F (36.7 C)] 98.1 F (36.7 C) (05/12 0610) Pulse Rate:  [52-64] 52 (05/12 0610) Resp:  [15-18] 15 (05/12 0610) BP: (153-164)/(60-62) 164/62 (05/12 0610) SpO2:  [95 %-98 %] 97 % (05/12 0610) Last BM Date : 11/20/23  Intake/Output from previous day: 05/11 0701 - 05/12 0700 In: 914.6 [P.O.:720; IV Piggyback:194.6] Out: 280 [Drains:280] Intake/Output this shift: Total I/O In: 271.5 [P.O.:240; IV Piggyback:31.5] Out: 0   PE: General: pleasant, WD, overweight male who is laying in bed in NAD HEENT: sclera anicteric Heart: regular, rate, and rhythm.   Lungs: Respiratory effort nonlabored Abd: soft, appropriately ttp, incisions C/D/I, drain SS Psych: A&Ox3 with an appropriate affect.    Lab Results:  Recent Labs    11/20/23 0550 11/21/23 0431  WBC 9.3 6.6  HGB 12.8* 12.9*  HCT 41.0 40.8  PLT 249 257   BMET Recent Labs    11/20/23 0550 11/21/23 0432  NA 138 136  K 3.7 4.0  CL 105 100  CO2 25 27  GLUCOSE 106* 99  BUN 27* 24*  CREATININE 1.30* 1.29*  CALCIUM 8.9 8.6*   PT/INR No results for input(s): "LABPROT", "INR" in the last 72 hours. CMP     Component Value Date/Time   NA 136 11/21/2023 0432   K 4.0 11/21/2023 0432   CL 100 11/21/2023 0432   CO2 27 11/21/2023 0432   GLUCOSE 99 11/21/2023 0432   BUN 24 (H) 11/21/2023 0432   CREATININE 1.29 (H) 11/21/2023 0432   CALCIUM 8.6 (L) 11/21/2023 0432   PROT 5.6 (L) 11/21/2023 0431   ALBUMIN 2.7 (L) 11/21/2023 0432   AST 52 (H) 11/21/2023 0431   ALT 110 (H) 11/21/2023 0431   ALKPHOS 37 (L) 11/21/2023 0431   BILITOT 0.8 11/21/2023 0431   GFRNONAA 56 (L) 11/21/2023 0432   GFRAA >60 10/13/2019 1244   Lipase     Component Value Date/Time   LIPASE 20 11/17/2023 0946       Studies/Results: No results  found.  Anti-infectives: Anti-infectives (From admission, onward)    Start     Dose/Rate Route Frequency Ordered Stop   11/17/23 2000  piperacillin -tazobactam (ZOSYN ) IVPB 3.375 g        3.375 g 12.5 mL/hr over 240 Minutes Intravenous Every 8 hours 11/17/23 1233     11/17/23 1245  piperacillin -tazobactam (ZOSYN ) IVPB 3.375 g        3.375 g 100 mL/hr over 30 Minutes Intravenous  Once 11/17/23 1233 11/17/23 1328        Assessment/Plan  POD3 s/p subtotal laparoscopic cholecystectomy - drain SS but high volume - will schedule drain check in office next week - otherwise stable for DC home from surgery perspective - sent Rx for oxycodone  - no further abx needed from surgery standpoint  - discussed post-op care and drain care with patient and wife  FEN: reg diet  VTE: SCDs ID: [email or web address]    LOS: 3 days     Annetta Killian, [facility] 11/21/2023, 9:12 AM Please see Amion for pager number during day hours 7:00am-4:30pm

## 2023-11-21 NOTE — Progress Notes (Signed)
 Mobility Specialist - Progress Note   11/21/23 1016  Mobility  Activity Ambulated with assistance in hallway  Level of Assistance Modified independent, requires aide device or extra time  Assistive Device Front wheel walker  Distance Ambulated (ft) 450 ft  Range of Motion/Exercises Active  Activity Response Tolerated well  Mobility Referral Yes  Mobility visit 1 Mobility  Mobility Specialist Start Time (ACUTE ONLY) 1000  Mobility Specialist Stop Time (ACUTE ONLY) 1016  Mobility Specialist Time Calculation (min) (ACUTE ONLY) 16 min   Pt was found in bed and agreeable to ambulate. No complaints with session. At EOS returned to use bathroom. Was left with wife in room.  Kristopher Padilla Mobility Specialist

## 2023-11-21 NOTE — Discharge Instructions (Signed)

## 2023-11-21 NOTE — Plan of Care (Signed)
  Problem: Education: Goal: Knowledge of General Education information will improve Description: Including pain rating scale, medication(s)/side effects and non-pharmacologic comfort measures 11/21/2023 0516 by Dempsey Finely, RN Outcome: Progressing 11/21/2023 0516 by Dempsey Finely, RN Outcome: Progressing   Problem: Health Behavior/Discharge Planning: Goal: Ability to manage health-related needs will improve 11/21/2023 0516 by Dempsey Finely, RN Outcome: Progressing 11/21/2023 0516 by Dempsey Finely, RN Outcome: Progressing   Problem: Clinical Measurements: Goal: Ability to maintain clinical measurements within normal limits will improve 11/21/2023 0516 by Dempsey Finely, RN Outcome: Progressing 11/21/2023 0516 by Dempsey Finely, RN Outcome: Progressing Goal: Will remain free from infection 11/21/2023 0516 by Dempsey Finely, RN Outcome: Progressing 11/21/2023 0516 by Dempsey Finely, RN Outcome: Progressing Goal: Diagnostic test results will improve 11/21/2023 0516 by Dempsey Finely, RN Outcome: Progressing 11/21/2023 0516 by Dempsey Finely, RN Outcome: Progressing Goal: Respiratory complications will improve 11/21/2023 0516 by Dempsey Finely, RN Outcome: Progressing 11/21/2023 0516 by Dempsey Finely, RN Outcome: Progressing Goal: Cardiovascular complication will be avoided 11/21/2023 0516 by Dempsey Finely, RN Outcome: Progressing 11/21/2023 0516 by Dempsey Finely, RN Outcome: Progressing   Problem: Activity: Goal: Risk for activity intolerance will decrease 11/21/2023 0516 by Dempsey Finely, RN Outcome: Progressing 11/21/2023 0516 by Dempsey Finely, RN Outcome: Progressing   Problem: Nutrition: Goal: Adequate nutrition will be maintained 11/21/2023 0516 by Dempsey Finely, RN Outcome: Progressing 11/21/2023 0516 by Dempsey Finely, RN Outcome:  Progressing   Problem: Coping: Goal: Level of anxiety will decrease 11/21/2023 0516 by Dempsey Finely, RN Outcome: Progressing 11/21/2023 0516 by Dempsey Finely, RN Outcome: Progressing   Problem: Elimination: Goal: Will not experience complications related to bowel motility Outcome: Progressing Goal: Will not experience complications related to urinary retention Outcome: Progressing   Problem: Pain Managment: Goal: General experience of comfort will improve and/or be controlled Outcome: Progressing   Problem: Safety: Goal: Ability to remain free from injury will improve Outcome: Progressing   Problem: Skin Integrity: Goal: Risk for impaired skin integrity will decrease Outcome: Progressing

## 2023-11-21 NOTE — Progress Notes (Signed)
 AVS reviewed w/ pt along w/ JP drain teaching. PIV removed as noted. Pt & wife s Ates he will follow up w/ PCP regarding lisinopril parameters for dosing related to HR & BP. Pt has a Zio patch in place & checks BP in the am. Pt & wife verbalized an understanding. Pt to bathroom, dressing for d/c to home.

## 2023-11-21 NOTE — Progress Notes (Signed)
 Discharge medication delivered to bedside [facility]

## 2023-11-21 NOTE — Discharge Summary (Signed)
 Physician Discharge Summary   Patient: Kristopher Padilla MRN: [id number] DOB: 06/20/1941  Admit date:     11/17/2023  Discharge date: 11/21/23  Discharge Physician: Bertram Brocks, MD    PCP: Roselind Congo, MD   Recommendations at discharge:   Cleared by surgery to discharge home, scheduled drain check in office next week  Discharge Diagnoses:    Acute cholecystitis   Essential hypertension   Hyperlipidemia   Hyperglycemia Acute kidney injury Constipation Obesity class I   Hospital Course:  Patient is a 83 year old male with HTN, HLP, aortic atherosclerosis presented to ED with abdominal pain.  Patient reported severe epigastric and right upper quadrant abdominal pain with nausea 5 days prior to admission, Poor appetite and has not had much to eat in the last 2 to 3 days.  He initially thought he may have kidney stones, followed up with urology and underwent outpatient CT renal stone study which showed pericholecystic stranding, punctate nonobstructing bilateral renal calculi without hydronephrosis, multiple bladder stones, no SBO and normal appendix.  Patient was referred to ED. Patient also reported constipation for 3 days with bloating. Patient also reported that he saw Dr. Berry Bristol of cardiology on 11/07/2023 for dizziness and  "jabs in his chest", had event monitor, was scheduled for a future exercise stress test.   Assessment and Plan:  Sepsis with acute cholecystitis -Patient met sepsis criteria on admission with tachycardia, borderline BP, leukocytosis, mild AKI, source likely due to acute cholecystitis -Outpatient CT with pericholecystic stranding, no bowel obstruction and normal appendix. - RUQ US  showed cholelithiasis with mild gallbladder wall thickening of 5 mm - General Surgery was consulted, patient underwent laparoscopic subtotal cholecystectomy with drain placement, on 5/9, postop day # 3 Continue IV Zosyn , pain control.   - Now improving, cleared by general surgery to  discharge home.  Recommended continue the drain and will schedule drain check in office next week.  No further antibiotics needed from surgical standpoint.  The postop care and drain care was discussed with patient and wife.     preop evaluation - Per patient he had been having 'jabs' in his chest intermittently although no frank chest pain, shortness of breath, DOE.   - Seen by cardiology, 2D echo showed EF of 65 to 70%, normal left ventricular function, right ventricular systolic function normal.      Essential hypertension - BP stable, continue antihypertensives   Acute kidney injury  -Creatinine 1.35 on admission, creatinine was 1.2 in 06/2023 likely due to poor p.o. intake, lisinopril, acute cholecystitis - Creatinine improved, 1.2 at baseline     Hyperglycemia  -Noted glucose 134 on presentation and was 188 on CMP in 06/2023 - HbA1c 5.3   Constipation - Resolved, changed to as needed stool softeners   Obesity class I Estimated body mass index is 29.27 kg/m as calculated from the following:   Height as of this encounter: 5\' 10"  (1.778 m).   Weight as of this encounter: 92.5 kg.      Pain control - [place]  Controlled Substance Reporting System database was reviewed. and patient was instructed, not to drive, operate heavy machinery, perform activities at heights, swimming or participation in water activities or provide baby-sitting services while on Pain, Sleep and Anxiety Medications; until their outpatient Physician has advised to do so again. Also recommended to not to take more than prescribed Pain, Sleep and Anxiety Medications.  Consultants: General Surgery, Procedures performed: s/p subtotal laparoscopic cholecystectomy   Disposition: Home Diet recommendation:  Discharge  Diet Orders (From admission, onward)     Start     Ordered   11/21/23 0000  Diet - low sodium heart healthy        11/21/23 1029            DISCHARGE MEDICATION: Allergies as of  11/21/2023   No Known Allergies      Medication List     TAKE these medications    ascorbic acid 500 MG tablet Commonly known as: VITAMIN C Take 250 mg by mouth daily.   Cyanocobalamin 1000 MCG Tbcr Take 1 tablet by mouth daily.   famotidine  20 MG tablet Commonly known as: PEPCID  Take 1 tablet (20 mg total) by mouth daily.   glucosamine-chondroitin 500-400 MG tablet Take 1 tablet by mouth 3 (three) times daily.   lisinopril 5 MG tablet Commonly known as: ZESTRIL Take 5 mg by mouth daily.   ondansetron  4 MG disintegrating tablet Commonly known as: ZOFRAN -ODT Take 1 tablet (4 mg total) by mouth every 8 (eight) hours as needed for nausea or vomiting.   oxyCODONE  5 MG immediate release tablet Commonly known as: Oxy IR/ROXICODONE  Take 1-2 tablets (5-10 mg total) by mouth every 4 (four) hours as needed for moderate pain (pain score 4-6) or severe pain (pain score 7-10) (5 moderate, 10 severe).   polyethylene glycol powder 17 GM/SCOOP powder Commonly known as: GLYCOLAX /MIRALAX  Take 17 g by mouth daily as needed for moderate constipation.   RED YEAST RICE PO Take 1 Dose by mouth daily.   SENIOR MULTIVITAMIN PLUS PO Take 1 Dose by mouth daily.   sildenafil 100 MG tablet Commonly known as: VIAGRA Take 100 mg by mouth as needed for erectile dysfunction.   Stool Softener/Laxative 50-8.6 MG tablet Generic drug: senna-docusate Take 1 tablet by mouth 2 (two) times daily. For constipation   tamsulosin  0.4 MG Caps capsule Commonly known as: FLOMAX  Take 1 capsule (0.4 mg total) by mouth daily after breakfast.   Travoprost (BAK Free) 0.004 % Soln ophthalmic solution Commonly known as: TRAVATAN Place 1 drop into both eyes at bedtime.        Follow-up Information     Edmon Gosling, MD. Go on 11/29/2023.   Specialty: General Surgery Why: 9:00 AM, please arrive 30 min prior to appointment time to check in. Contact information: [address] [place] [address] [phone]         Roselind Congo, MD. Schedule an appointment as soon as possible for a visit in 2 week(s).   Specialty: Family Medicine Why: for hospital follow-up Contact information: [address] [place] [address] [phone]                Discharge Exam: Filed Weights   11/17/23 0944 11/18/23 0911 11/18/23 1313  Weight: 92.5 kg 92.5 kg 92.5 kg   S: No acute complaints, feeling better today, no nausea vomiting, chest pain or shortness of breath.  Tolerating solid diet.  Cleared by general surgery to discharge home.  BP (!) 164/62 (BP Location: Left Arm)   Pulse (!) 52   Temp 98.1 F (36.7 C) (Oral)   Resp 15   Ht 5\' 10"  (1.778 m)   Wt 92.5 kg   SpO2 97%   BMI 29.27 kg/m   Physical Exam General: Alert and oriented x 3, NAD Cardiovascular: S1 S2 clear, RRR.  Respiratory: CTAB, no wheezing Gastrointestinal: Soft, appropriately tender, nondistended, NBS, drain+ Ext: no pedal edema bilaterally Neuro: no  new deficits Psych: Normal affect    Condition at discharge: fair  The results of significant diagnostics from this hospitalization (including imaging, microbiology, ancillary and laboratory) are listed below for reference.   Imaging Studies: ECHOCARDIOGRAM LIMITED Result Date: 11/18/2023    ECHOCARDIOGRAM LIMITED REPORT   Patient Name:   Kristopher Padilla Date of Exam: 11/18/2023 Medical Rec #:  [id number]    Height:       70.0 in Accession #:    [id number]   Weight:       204.0 lb Date of Birth:  Oct 14, 1940    BSA:          2.105 m Patient Age:    82 years     BP:           140/58 mmHg Patient Gender: M            HR:           69 bpm. Exam Location:  Inpatient Procedure: 2D Echo, Cardiac Doppler and Color Doppler (Both Spectral and Color            Flow Doppler were utilized during procedure). Indications:    Limited for LV function, pre-op  History:        Patient has no prior history of Echocardiogram examinations.                  Risk Factors:Hypertension.  Sonographer:    Andrena Bang Referring Phys: [id number] CALLIE E GOODRICH IMPRESSIONS  1. Left ventricular ejection fraction, by estimation, is 65 to 70%. The left ventricle has normal function.  2. Right ventricular systolic function is normal. The right ventricular size is normal.  3. The aortic valve is tricuspid. There is moderate calcification of the aortic valve. Aortic valve regurgitation is trivial. Aortic valve sclerosis is present, with no evidence of aortic valve stenosis. FINDINGS  Left Ventricle: Left ventricular ejection fraction, by estimation, is 65 to 70%. The left ventricle has normal function. The left ventricular internal cavity size was normal in size. There is no left ventricular hypertrophy. Right Ventricle: The right ventricular size is normal. Right vetricular wall thickness was not assessed. Right ventricular systolic function is normal. Pericardium: There is no evidence of pericardial effusion. Aortic Valve: The aortic valve is tricuspid. There is moderate calcification of the aortic valve. Aortic valve regurgitation is trivial. Aortic valve sclerosis is present, with no evidence of aortic valve stenosis. Aortic valve mean gradient measures 5.0  mmHg. Aortic valve peak gradient measures 8.9 mmHg. Aortic valve area, by VTI measures 3.45 cm. LEFT VENTRICLE PLAX 2D LVOT diam:     2.20 cm LV SV:         101 LV SV Index:   48 LVOT Area:     3.80 cm  LV Volumes (MOD) LV vol d, MOD A2C: 123.0 ml LV vol d, MOD A4C: 112.0 ml LV vol s, MOD A2C: 28.2 ml LV vol s, MOD A4C: 37.4 ml LV SV MOD A2C:     94.8 ml LV SV MOD A4C:     112.0 ml LV SV MOD BP:      85.4 ml AORTIC VALVE AV Area (Vmax):    3.16 cm AV Area (Vmean):   2.89 cm AV Area (VTI):     3.45 cm AV Vmax:           149.00 cm/s AV Vmean:          104.000 cm/s AV VTI:  0.294 m AV Peak Grad:      8.9 mmHg AV Mean Grad:      5.0 mmHg LVOT Vmax:         124.00 cm/s LVOT Vmean:        79.200 cm/s LVOT  VTI:          0.267 m LVOT/AV VTI ratio: 0.91  SHUNTS Systemic VTI:  0.27 m Systemic Diam: 2.20 cm Janelle Mediate MD Electronically signed by Janelle Mediate MD Signature Date/Time: 11/18/2023/10:49:04 AM    Final    US  ABDOMEN LIMITED RUQ (LIVER/GB) Result Date: 11/17/2023 CLINICAL DATA:  151471 RUQ pain 151471 EXAM: ULTRASOUND ABDOMEN LIMITED RIGHT UPPER QUADRANT COMPARISON:  June 29, 2023 FINDINGS: Gallbladder: The gallbladder is full of stones and sludge. Mild gallbladder wall thickening measuring 5 mm. No sonographic Murphy's sign noted by sonographer. Common bile duct: Diameter: 3 mm Liver: Normal echogenicity. A few small cysts present measuring up to 2 cm. No intrahepatic biliary ductal dilation. Portal vein is patent on color Doppler imaging with normal direction of blood flow towards the liver. Other: Mild right-sided hydronephrosis. IMPRESSION: 1. Mild right-sided hydronephrosis. 2. The gallbladder is full of stones and biliary sludge. Mild gallbladder wall thickening along the hepatic surface of the gallbladder, measuring 5 mm. In the absence of a sonographic Murphy's sign and pericholecystic fluid, these changes may be related to acute hepatitis, upper abdominal inflammation, or volume overload, either from CHF or renal failure. Laboratory correlation recommended. Electronically Signed   By: Rance Burrows M.D.   On: 11/17/2023 12:09    Microbiology: Results for orders placed or performed during the hospital encounter of 11/17/23  Culture, blood (Routine X 2) w Reflex to ID Panel     Status: None (Preliminary result)   Collection Time: 11/17/23  6:28 PM   Specimen: BLOOD LEFT ARM  Result Value Ref Range Status   Specimen Description   Final    BLOOD LEFT ARM Performed at [facility], [address], [place], [place] [address]    Special Requests   Final    BOTTLES DRAWN AEROBIC AND ANAEROBIC Blood Culture adequate volume Performed at [facility], [address] [place], [place] [address]    Culture   Final    NO GROWTH 4 DAYS Performed at [facility], [address], [place], [place] [address]    Report Status PENDING  Incomplete  Culture, blood (Routine X 2) w Reflex to ID Panel     Status: None (Preliminary result)   Collection Time: 11/17/23  6:38 PM   Specimen: BLOOD LEFT ARM  Result Value Ref Range Status   Specimen Description   Final    BLOOD LEFT ARM Performed at [facility], [address] [place], [place] [address]    Special Requests   Final    BOTTLES DRAWN AEROBIC AND ANAEROBIC Blood Culture adequate volume Performed at [facility], [address], [place], [place] [address]    Culture   Final    NO GROWTH 4 DAYS Performed at [facility], [address] [place], [place] [address]    Report Status PENDING  Incomplete    Labs: CBC: Recent Labs  Lab 11/17/23 0946 11/18/23 0441 11/19/23 0613 11/20/23 0550 11/21/23 0431  WBC 23.5* 23.3* 10.6* 9.3 6.6  HGB 16.0 14.8 12.3* 12.8* 12.9*  HCT 46.7 45.7 38.3* 41.0 40.8  MCV 88.6 94.0 93.9 96.9 96.0  PLT 215  229 210 249 257   Basic Metabolic Panel: Recent Labs  Lab 11/17/23 0946 11/18/23 0441 11/19/23 0614 11/20/23 0550 11/21/23 0432  NA 135 134* 135 138 136  K 4.3 4.0 4.8 3.7 4.0  CL 98 99 102 105 100  CO2 23 24 26 25 27   GLUCOSE 134* 139* 134* 106* 99  BUN 18 23 30* 27* 24*  CREATININE 1.35* 1.25* 1.35* 1.30* 1.29*  CALCIUM 9.7 9.1 8.7* 8.9 8.6*  PHOS  --   --  3.7 2.5 4.0   Liver Function Tests: Recent Labs  Lab 11/17/23 0946 11/18/23 0441 11/19/23 0613 11/19/23 0614 11/20/23 0550 11/21/23 0431 11/21/23 0432  AST 23 20 49*  --  86* 52*  --   ALT 16 19 55*  --  122* 110*  --   ALKPHOS 51 43 41  --  42 37*  --   BILITOT 1.1 1.3* 1.1  --  1.1 0.8  --   PROT 7.2 6.7 5.8*  --  5.8* 5.6*  --   ALBUMIN 4.0 3.4* 2.8* 2.7* 2.8*  2.8* 2.7* 2.7*   CBG: No results for input(s): "GLUCAP" in the last 168  hours.  Discharge time spent: greater than 30 minutes.  Signed: Bertram Brocks, MD [facility] 11/21/2023

## 2023-11-22 ENCOUNTER — Other Ambulatory Visit (HOSPITAL_COMMUNITY): Payer: Self-pay

## 2023-11-22 ENCOUNTER — Other Ambulatory Visit: Payer: Self-pay | Admitting: Medical Genetics

## 2023-11-22 ENCOUNTER — Telehealth: Payer: Self-pay | Admitting: Cardiology

## 2023-11-22 LAB — CULTURE, BLOOD (ROUTINE X 2)
Culture: NO GROWTH
Culture: NO GROWTH
Special Requests: ADEQUATE
Special Requests: ADEQUATE

## 2023-11-22 LAB — SURGICAL PATHOLOGY

## 2023-11-22 NOTE — Telephone Encounter (Signed)
 Wife is calling because patient's had surgery and need to know when they should r/s his procedure. Please advise

## 2023-11-22 NOTE — Telephone Encounter (Signed)
 Reviewed with Dr Berry Bristol and stress test should be rescheduled for about 6 weeks from now.  Patient's wife notified.  She is aware office will call to reschedule this test

## 2023-11-22 NOTE — Telephone Encounter (Signed)
 Spoke with wife and patient. Patient just had laparoscopic cholecystectomy on 5/9. He as a scheduled stress test on 5/22. She would like to know if he needs to reschedule the stress test to a later date because of his surgery

## 2023-11-28 DIAGNOSIS — N189 Chronic kidney disease, unspecified: Secondary | ICD-10-CM | POA: Diagnosis not present

## 2023-11-30 DIAGNOSIS — M7632 Iliotibial band syndrome, left leg: Secondary | ICD-10-CM | POA: Diagnosis not present

## 2023-11-30 DIAGNOSIS — Z9049 Acquired absence of other specified parts of digestive tract: Secondary | ICD-10-CM | POA: Diagnosis not present

## 2023-11-30 DIAGNOSIS — M25562 Pain in left knee: Secondary | ICD-10-CM | POA: Diagnosis not present

## 2023-12-01 ENCOUNTER — Ambulatory Visit (HOSPITAL_COMMUNITY)

## 2023-12-01 ENCOUNTER — Encounter (HOSPITAL_COMMUNITY)

## 2023-12-01 DIAGNOSIS — K810 Acute cholecystitis: Secondary | ICD-10-CM | POA: Diagnosis not present

## 2023-12-01 DIAGNOSIS — K219 Gastro-esophageal reflux disease without esophagitis: Secondary | ICD-10-CM | POA: Diagnosis not present

## 2023-12-01 DIAGNOSIS — R300 Dysuria: Secondary | ICD-10-CM | POA: Diagnosis not present

## 2023-12-01 DIAGNOSIS — I10 Essential (primary) hypertension: Secondary | ICD-10-CM | POA: Diagnosis not present

## 2023-12-01 DIAGNOSIS — E782 Mixed hyperlipidemia: Secondary | ICD-10-CM | POA: Diagnosis not present

## 2023-12-01 DIAGNOSIS — N1831 Chronic kidney disease, stage 3a: Secondary | ICD-10-CM | POA: Diagnosis not present

## 2023-12-10 DIAGNOSIS — E782 Mixed hyperlipidemia: Secondary | ICD-10-CM | POA: Diagnosis not present

## 2023-12-10 DIAGNOSIS — N400 Enlarged prostate without lower urinary tract symptoms: Secondary | ICD-10-CM | POA: Diagnosis not present

## 2023-12-10 DIAGNOSIS — N189 Chronic kidney disease, unspecified: Secondary | ICD-10-CM | POA: Diagnosis not present

## 2023-12-13 DIAGNOSIS — R0789 Other chest pain: Secondary | ICD-10-CM | POA: Diagnosis not present

## 2023-12-13 DIAGNOSIS — R42 Dizziness and giddiness: Secondary | ICD-10-CM | POA: Diagnosis not present

## 2023-12-14 ENCOUNTER — Ambulatory Visit: Payer: Self-pay | Admitting: Cardiology

## 2023-12-14 DIAGNOSIS — R0789 Other chest pain: Secondary | ICD-10-CM

## 2023-12-14 DIAGNOSIS — I700 Atherosclerosis of aorta: Secondary | ICD-10-CM

## 2023-12-14 DIAGNOSIS — I10 Essential (primary) hypertension: Secondary | ICD-10-CM | POA: Diagnosis not present

## 2023-12-14 DIAGNOSIS — R42 Dizziness and giddiness: Secondary | ICD-10-CM | POA: Diagnosis not present

## 2023-12-14 NOTE — Progress Notes (Signed)
 Unremarkable transmission, no high degree AV block noted and no symptoms reported.  Dr. Avanell Bob, you saw the patient inpatient do not know whether he needs any follow-up with you.

## 2023-12-21 DIAGNOSIS — M25552 Pain in left hip: Secondary | ICD-10-CM | POA: Diagnosis not present

## 2023-12-22 ENCOUNTER — Telehealth (HOSPITAL_COMMUNITY): Payer: Self-pay | Admitting: *Deleted

## 2023-12-22 NOTE — Telephone Encounter (Signed)
 Spoke to patient regarding his ETT on 12/29/23 at 12:45.

## 2023-12-23 DIAGNOSIS — H401122 Primary open-angle glaucoma, left eye, moderate stage: Secondary | ICD-10-CM | POA: Diagnosis not present

## 2023-12-23 DIAGNOSIS — H16223 Keratoconjunctivitis sicca, not specified as Sjogren's, bilateral: Secondary | ICD-10-CM | POA: Diagnosis not present

## 2023-12-23 DIAGNOSIS — H401111 Primary open-angle glaucoma, right eye, mild stage: Secondary | ICD-10-CM | POA: Diagnosis not present

## 2023-12-23 DIAGNOSIS — H44612 Retained (old) magnetic foreign body in anterior chamber, left eye: Secondary | ICD-10-CM | POA: Diagnosis not present

## 2023-12-28 DIAGNOSIS — M25552 Pain in left hip: Secondary | ICD-10-CM | POA: Diagnosis not present

## 2023-12-28 DIAGNOSIS — N189 Chronic kidney disease, unspecified: Secondary | ICD-10-CM | POA: Diagnosis not present

## 2023-12-29 ENCOUNTER — Ambulatory Visit (HOSPITAL_COMMUNITY)
Admission: RE | Admit: 2023-12-29 | Discharge: 2023-12-29 | Disposition: A | Discharge status: Home / Self Care | Source: Ambulatory Visit | Attending: Cardiology | Admitting: Cardiology

## 2023-12-29 DIAGNOSIS — R0789 Other chest pain: Secondary | ICD-10-CM

## 2023-12-29 DIAGNOSIS — I10 Essential (primary) hypertension: Secondary | ICD-10-CM | POA: Diagnosis not present

## 2023-12-29 DIAGNOSIS — R42 Dizziness and giddiness: Secondary | ICD-10-CM | POA: Insufficient documentation

## 2023-12-29 DIAGNOSIS — I700 Atherosclerosis of aorta: Secondary | ICD-10-CM | POA: Diagnosis not present

## 2023-12-29 LAB — EXERCISE TOLERANCE TEST
Angina Index: 0
Duke Treadmill Score: 4
Estimated workload: 6.3
Exercise duration (min): 4 min
Exercise duration (sec): 26 s
MPHR: 140 {beats}/min
Peak HR: 121 {beats}/min
Percent HR: 86 %
Rest HR: 64 {beats}/min
ST Depression (mm): 0 mm

## 2023-12-30 DIAGNOSIS — M25552 Pain in left hip: Secondary | ICD-10-CM | POA: Diagnosis not present

## 2024-01-02 DIAGNOSIS — M25552 Pain in left hip: Secondary | ICD-10-CM | POA: Diagnosis not present

## 2024-01-02 NOTE — Progress Notes (Signed)
 Treadmill Exercise Stress 12/29/2023: Exercise stress test: CLinically and electrically negative for ischemia  The patient exercised according to the BRUCE for 04: 26 min: s, achieving a work level of Max. METS: 6. 3.  The resting heart rate of 64 bpm rose to a maximal heart rate of 121 bpm. This value represents 86 % of the maximal, age- predicted heart rate.  The resting blood pressure of 143/ 66 mmHg , rose to a maximum blood pressure of 184/ 70 mmHg.

## 2024-01-04 ENCOUNTER — Ambulatory Visit: Attending: Cardiology | Admitting: Cardiology

## 2024-01-04 ENCOUNTER — Encounter: Payer: Self-pay | Admitting: Cardiology

## 2024-01-04 ENCOUNTER — Other Ambulatory Visit: Payer: Self-pay | Admitting: *Deleted

## 2024-01-04 VITALS — BP 147/76 | HR 59 | Resp 16 | Ht 70.0 in | Wt 201.2 lb

## 2024-01-04 DIAGNOSIS — R0789 Other chest pain: Secondary | ICD-10-CM | POA: Insufficient documentation

## 2024-01-04 DIAGNOSIS — I10 Essential (primary) hypertension: Secondary | ICD-10-CM | POA: Diagnosis not present

## 2024-01-04 MED ORDER — LISINOPRIL-HYDROCHLOROTHIAZIDE 10-12.5 MG PO TABS: 1.0000 | ORAL_TABLET | ORAL | 1 refills | Status: DC

## 2024-01-04 NOTE — Progress Notes (Signed)
 Cardiology Office Note:  .   Date:  01/04/2024  ID:  KENWOOD ROSIAK, DOB [birth year], MRN [id number] PCP: Arloa Elsie SAUNDERS, MD  [facility] Providers Cardiologist:  Gordy Bergamo, MD   History of Present Illness: .   Kristopher Padilla is a 83 y.o. Caucasian male patient with hypertension, hypercholesterolemia will treat, mild coronary calcification noted on the CT scan of the chest on 09/26/2017 and aortic atherosclerosis initially seen by me on 10/30/2023 for chest pain.  Routine treadmill exercise stress test on 12/29/2023 was nonischemic and echocardiogram on 11/18/2023 revealing essentially normal wall motion with EF 65 to 70% without significant valvular abnormality but for presence of moderate aortic valve calcification without aortic valve stenosis.    Patient presented with acute cholecystitis to the emergency room on 11/17/2023 and underwent laparoscopic subtotal cholecystectomy and eventually discharged home on 11/21/2023.  Discussed the use of AI scribe software for clinical note transcription with the patient, who gave verbal consent to proceed.  History of Present Illness Kristopher Padilla is an 83 year old male who presents for follow-up after a laparoscopic cholecystectomy and evaluation of his heart rate and blood pressure management.  His heart rate remains around fifty or below, causing concern. He is on lisinopril and questions the dosage due to refill notices. Blood pressure readings are mostly in the 140s, occasionally reaching 160s, monitored every morning. A recent stress test on December 29, 2023, showed no chest pain or EKG changes. He takes red yeast rice for cholesterol management, which is well controlled. 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  Labs   Lab Results  Component Value Date   NA 136 11/21/2023   K 4.0 11/21/2023   CO2 27 11/21/2023   GLUCOSE 99 11/21/2023   BUN 24 (H)  11/21/2023   CREATININE 1.29 (H) 11/21/2023   CALCIUM 8.6 (L) 11/21/2023   GFRNONAA 56 (L) 11/21/2023      Latest Ref Rng & Units 11/21/2023    4:32 AM 11/20/2023    5:50 AM 11/19/2023    6:14 AM  BMP  Glucose 70 - 99 mg/dL 99  893  865   BUN 8 - 23 mg/dL 24  27  30    Creatinine 0.61 - 1.24 mg/dL 8.70  8.69  8.64   Sodium 135 - 145 mmol/L 136  138  135   Potassium 3.5 - 5.1 mmol/L 4.0  3.7  4.8   Chloride 98 - 111 mmol/L 100  105  102   CO2 22 - 32 mmol/L 27  25  26    Calcium 8.9 - 10.3 mg/dL 8.6  8.9  8.7       Latest Ref Rng & Units 11/21/2023    4:31 AM 11/20/2023    5:50 AM 11/19/2023    6:13 AM  CBC  WBC 4.0 - 10.5 K/uL 6.6  9.3  10.6   Hemoglobin 13.0 - 17.0 g/dL 87.0  87.1  87.6   Hematocrit 39.0 - 52.0 % 40.8  41.0  38.3   Platelets 150 - 400 K/uL 257  249  210    Lab Results  Component Value Date   HGBA1C 5.3 11/17/2023    External Labs:  PCP labs faxed over 10/13/2023:   Serum creatinine 1.20, EGFR 61 mL, BUN 19.  Sodium 138, potassium 4.2, LFTs normal.   Total cholesterol 104, triglycerides 82, HDL 38, LDL 49.  TSH 2.160, normal.  A1c 5.9%.  ROS  Review of Systems  Cardiovascular:  Negative for chest pain, dyspnea on exertion and leg swelling.    Physical Exam:   VS:  BP (!) 147/76 (BP Location: Left Arm, Patient Position: Sitting, Cuff Size: Large)   Pulse (!) 59   Resp 16   Ht 5' 10 (1.778 m)   Wt 201 lb 3.2 oz (91.3 kg)   SpO2 97%   BMI 28.87 kg/m    Wt Readings from Last 3 Encounters:  01/04/24 201 lb 3.2 oz (91.3 kg)  11/18/23 204 lb (92.5 kg)  11/07/23 212 lb (96.2 kg)    Physical Exam Neck:     Vascular: No carotid bruit or JVD.   Cardiovascular:     Rate and Rhythm: Normal rate and regular rhythm.     Pulses: Intact distal pulses.     Heart sounds: Normal heart sounds. No murmur heard.    No gallop.  Pulmonary:     Effort: Pulmonary effort is normal.     Breath sounds: Normal breath sounds.  Abdominal:     General: Bowel sounds  are normal.     Palpations: Abdomen is soft.   Musculoskeletal:     Right lower leg: No edema.     Left lower leg: No edema.    Studies Reviewed: SABRA    Treadmill Exercise Stress 12/29/2023: Exercise stress test: CLinically and electrically negative for ischemia  The patient exercised according to the BRUCE for 04: 26 min: s, achieving a work level of Max. METS: 6. 3.  The resting heart rate of 64 bpm rose to a maximal heart rate of 121 bpm. This value represents 86 % of the maximal, age- predicted heart rate.  The resting blood pressure of 143/ 66 mmHg , rose to a maximum blood pressure of 184/ 70 mmHg.  ECHOCARDIOGRAM LIMITED 11/18/2023   1. Left ventricular ejection fraction, by estimation, is 65 to 70%. The left ventricle has normal function.  2. Right ventricular systolic function is normal. The right ventricular size is normal.  3. The aortic valve is tricuspid. There is moderate calcification of the aortic valve. Aortic valve regurgitation is trivial. Aortic valve sclerosis is present, with no evidence of aortic valve stenosis.  LONG TERM MONITOR (3-14 DAYS) 12/13/2023   Narrative Remote extended EKG monitoring 14 days starting 11/12/2023 for dizziness: Predominant rhythm sinus rhythm.  Minimum heart rate 41 bpm at 7 AM and maximum 124 bpm at 8:30 AM with average heart rate of 65 bpm. First-degree AV block was noted.  No other high degree AV block. There were 32 SVT episodes, EKG rhythm strip reveals brief atrial tachycardia, longest 11 beats.  There was no atrial fibrillation. Isolated PVCs, rare ventricular couplets and occasional ventricular bigeminy and trigeminy were present.  There was no NSVT episodes. No patient triggered events or symptomatic events noted.  Overall unremarkable transmission.  EKG:         Medications ordered    Meds ordered this encounter  Medications   lisinopril-hydrochlorothiazide (ZESTORETIC) 10-12.5 MG tablet    Sig: Take 1 tablet by mouth every  morning.    Dispense:  90 tablet    Refill:  1     ASSESSMENT AND PLAN: .      ICD-10-CM   1. Chest pain, musculoskeletal  R07.89 Basic Metabolic Panel (BMET)    2. Primary hypertension  I10 lisinopril-hydrochlorothiazide (ZESTORETIC) 10-12.5 MG tablet    Basic Metabolic Panel (BMET)  Assessment & Plan  Initially referred for evaluation of chest pain suggestive of musculoskeletal chest pain and dizziness.  No recurrence. Also patient was complaining about dizziness, event monitor does not reveal any significant abnormality has adequate heart rate response, extended EKG monitoring for 2 weeks revealing no significant abnormality.  Essential Hypertension Blood pressure readings consistently range from 140s to 160s, indicating suboptimal control. Current lisinopril 5 mg regimen is insufficient. Target blood pressure is 130/80 mmHg to prevent cardiovascular complications. - Increase lisinopril to 10 mg daily. - Add hydrochlorothiazide for improved blood pressure control. - Instruct to monitor blood pressure regularly and report deviations from target range. - Order BMP in 2-3 weeks to monitor renal function and electrolytes. - May need Lisinopril HCT 20/12.5 mg dose  Bradycardia Heart rate is approximately 50 bpm. Stress test shows adequate heart rate response to exercise, indicating no significant concern. Low heart rate is beneficial in current clinical context. - Reassure that low heart rate is not concerning given adequate heart rate response during stress test.  Exercise and Lifestyle Modification Exercise capacity during stress test is below desired level, indicating need for improved cardiovascular fitness. Regular exercise, specifically walking 30 minutes daily, is essential for cardiovascular health. - Encourage daily walking for 30 minutes to improve exercise capacity and cardiovascular health. - Advise incorporating swimming or water walking for additional  benefits.  Cholecystectomy Recent laparoscopic cholecystectomy for acute cholecystitis.  Overall patient remained stable without symptoms, lipids normal, physical examination is normal, low risk exercise stress test and echocardiogram revealing no significant abnormality.    Signed,  Gordy Bergamo, MD, [facility] 01/04/2024, 8:41 PM [facility] [address] [place], [place] [address] Phone: [phone]. Fax:  [phone]

## 2024-01-04 NOTE — Patient Instructions (Addendum)
 Medication Instructions:  Your physician has recommended you make the following change in your medication: Start lisinopril hydrochlorothiazide 10/12.5 mg by mouth daily   *If you need a refill on your cardiac medications before your next appointment, please call your pharmacy*  Lab Work: Have lab work (BMP) checked in about 2-3 weeks.  This is not fasting.  Can be done at any [facility].  There is a [facility] on the first floor of our building If you have labs (blood work) drawn today and your tests are completely normal, you will receive your results only by: [employer] (if you have MyChart) OR A paper copy in the mail If you have any lab test that is abnormal or we need to change your treatment, we will call you to review the results.  Testing/Procedures: none  Follow-Up: At [facility], you and your health needs are our priority.  As part of our continuing mission to provide you with exceptional heart care, our providers are all part of one team.  This team includes your primary Cardiologist (physician) and Advanced Practice Providers or APPs (Physician Assistants and Nurse Practitioners) who all work together to provide you with the care you need, when you need it.  Your next appointment:   As needed  Provider:   Gordy Bergamo, MD    We recommend signing up for the patient portal called MyChart.  Sign up information is provided on this After Visit Summary.  MyChart is used to connect with patients for Virtual Visits (Telemedicine).  Patients are able to view lab/test results, encounter notes, upcoming appointments, etc.  Non-urgent messages can be sent to your provider as well.   To learn more about what you can do with MyChart, go to [email or web address].   Other Instructions

## 2024-01-05 DIAGNOSIS — M25552 Pain in left hip: Secondary | ICD-10-CM | POA: Diagnosis not present

## 2024-01-09 DIAGNOSIS — M25552 Pain in left hip: Secondary | ICD-10-CM | POA: Diagnosis not present

## 2024-01-09 DIAGNOSIS — E782 Mixed hyperlipidemia: Secondary | ICD-10-CM | POA: Diagnosis not present

## 2024-01-09 DIAGNOSIS — N400 Enlarged prostate without lower urinary tract symptoms: Secondary | ICD-10-CM | POA: Diagnosis not present

## 2024-01-09 DIAGNOSIS — N189 Chronic kidney disease, unspecified: Secondary | ICD-10-CM | POA: Diagnosis not present

## 2024-01-12 DIAGNOSIS — M25552 Pain in left hip: Secondary | ICD-10-CM | POA: Diagnosis not present

## 2024-01-16 DIAGNOSIS — R0789 Other chest pain: Secondary | ICD-10-CM | POA: Diagnosis not present

## 2024-01-16 DIAGNOSIS — M25552 Pain in left hip: Secondary | ICD-10-CM | POA: Diagnosis not present

## 2024-01-16 DIAGNOSIS — I10 Essential (primary) hypertension: Secondary | ICD-10-CM | POA: Diagnosis not present

## 2024-01-17 ENCOUNTER — Ambulatory Visit: Payer: Self-pay | Admitting: Cardiology

## 2024-01-17 DIAGNOSIS — N1832 Chronic kidney disease, stage 3b: Secondary | ICD-10-CM

## 2024-01-17 DIAGNOSIS — I10 Essential (primary) hypertension: Secondary | ICD-10-CM

## 2024-01-17 LAB — BASIC METABOLIC PANEL WITH GFR
BUN/Creatinine Ratio: 20 (ref 10–24)
BUN: 30 mg/dL — ABNORMAL HIGH (ref 8–27)
CO2: 21 mmol/L (ref 20–29)
Calcium: 10.1 mg/dL (ref 8.6–10.2)
Chloride: 105 mmol/L (ref 96–106)
Creatinine, Ser: 1.51 mg/dL — ABNORMAL HIGH (ref 0.76–1.27)
Glucose: 98 mg/dL (ref 70–99)
Potassium: 5 mmol/L (ref 3.5–5.2)
Sodium: 142 mmol/L (ref 134–144)
eGFR: 46 mL/min/1.73 — ABNORMAL LOW (ref >59)

## 2024-01-17 NOTE — Progress Notes (Signed)
 Repeat BMP in 3 weeks please. Overall close to baseline with stable 3 a-b CKD since changing lisinopril  to lisinopril  HCT. Orders for BMP have been placed, please go to the [facility] in 2 weeks to 3 weeks.

## 2024-01-19 DIAGNOSIS — M25552 Pain in left hip: Secondary | ICD-10-CM | POA: Diagnosis not present

## 2024-01-25 DIAGNOSIS — M79644 Pain in right finger(s): Secondary | ICD-10-CM | POA: Diagnosis not present

## 2024-01-25 DIAGNOSIS — M7632 Iliotibial band syndrome, left leg: Secondary | ICD-10-CM | POA: Diagnosis not present

## 2024-01-27 DIAGNOSIS — N189 Chronic kidney disease, unspecified: Secondary | ICD-10-CM | POA: Diagnosis not present

## 2024-02-02 DIAGNOSIS — R001 Bradycardia, unspecified: Secondary | ICD-10-CM | POA: Diagnosis not present

## 2024-02-02 DIAGNOSIS — I10 Essential (primary) hypertension: Secondary | ICD-10-CM | POA: Diagnosis not present

## 2024-02-09 DIAGNOSIS — N400 Enlarged prostate without lower urinary tract symptoms: Secondary | ICD-10-CM | POA: Diagnosis not present

## 2024-02-09 DIAGNOSIS — E782 Mixed hyperlipidemia: Secondary | ICD-10-CM | POA: Diagnosis not present

## 2024-02-09 DIAGNOSIS — N189 Chronic kidney disease, unspecified: Secondary | ICD-10-CM | POA: Diagnosis not present

## 2024-02-16 DIAGNOSIS — T63301A Toxic effect of unspecified spider venom, accidental (unintentional), initial encounter: Secondary | ICD-10-CM | POA: Diagnosis not present

## 2024-02-16 DIAGNOSIS — M79644 Pain in right finger(s): Secondary | ICD-10-CM | POA: Diagnosis not present

## 2024-02-17 DIAGNOSIS — N400 Enlarged prostate without lower urinary tract symptoms: Secondary | ICD-10-CM | POA: Diagnosis not present

## 2024-02-17 DIAGNOSIS — N281 Cyst of kidney, acquired: Secondary | ICD-10-CM | POA: Diagnosis not present

## 2024-02-17 DIAGNOSIS — N200 Calculus of kidney: Secondary | ICD-10-CM | POA: Diagnosis not present

## 2024-02-17 DIAGNOSIS — K7689 Other specified diseases of liver: Secondary | ICD-10-CM | POA: Diagnosis not present

## 2024-02-17 DIAGNOSIS — N210 Calculus in bladder: Secondary | ICD-10-CM | POA: Diagnosis not present

## 2024-02-17 DIAGNOSIS — R1084 Generalized abdominal pain: Secondary | ICD-10-CM | POA: Diagnosis not present

## 2024-02-26 DIAGNOSIS — N189 Chronic kidney disease, unspecified: Secondary | ICD-10-CM | POA: Diagnosis not present

## 2024-03-02 NOTE — Progress Notes (Signed)
 Related to hydrochlorothiazide  component and if bad, will have to stop the medication and change to something else

## 2024-03-05 DIAGNOSIS — N1832 Chronic kidney disease, stage 3b: Secondary | ICD-10-CM | POA: Diagnosis not present

## 2024-03-05 DIAGNOSIS — I10 Essential (primary) hypertension: Secondary | ICD-10-CM | POA: Diagnosis not present

## 2024-03-06 LAB — BASIC METABOLIC PANEL WITH GFR
BUN/Creatinine Ratio: 19 (ref 10–24)
BUN: 25 mg/dL (ref 8–27)
CO2: 22 mmol/L (ref 20–29)
Calcium: 9.8 mg/dL (ref 8.6–10.2)
Chloride: 106 mmol/L (ref 96–106)
Creatinine, Ser: 1.34 mg/dL — ABNORMAL HIGH (ref 0.76–1.27)
Glucose: 90 mg/dL (ref 70–99)
Potassium: 4.9 mmol/L (ref 3.5–5.2)
Sodium: 142 mmol/L (ref 134–144)
eGFR: 53 mL/min/1.73 — ABNORMAL LOW (ref >59)

## 2024-03-06 MED ORDER — LOSARTAN POTASSIUM 50 MG PO TABS: 50.0000 mg | ORAL_TABLET | Freq: Every day | ORAL | 3 refills | Status: AC

## 2024-03-06 NOTE — Progress Notes (Signed)
 Stable renal function on Lisinopril  HCT.

## 2024-03-06 NOTE — Telephone Encounter (Signed)
 ICD-10-CM   1. Primary hypertension  I10 Basic metabolic panel with GFR    losartan  (COZAAR ) 50 MG tablet    2. Stage 3b chronic kidney disease (HCC)  N18.32 Basic metabolic panel with GFR     Meds ordered this encounter  Medications   losartan  (COZAAR ) 50 MG tablet    Sig: Take 1 tablet (50 mg total) by mouth daily.    Dispense:  90 tablet    Refill:  3    Discontinue lisinopril  HCT. Hair loss    Medications Discontinued During This Encounter  Medication Reason   lisinopril -hydrochlorothiazide  (ZESTORETIC ) 10-12.5 MG tablet Side effect (s)

## 2024-03-06 NOTE — Progress Notes (Signed)
 I stopped his Lisinopril  H and changed to Losartan . Sent to [facility]. He can continue to monitor his BP and let us  know if > 130/80 mm Hg. Refills with PCP

## 2024-03-06 NOTE — Progress Notes (Signed)
 Very stable renal function and normal potassium. I send him mychart message this morning. If hair loss is not an issue, to continue present meds

## 2024-03-11 DIAGNOSIS — N400 Enlarged prostate without lower urinary tract symptoms: Secondary | ICD-10-CM | POA: Diagnosis not present

## 2024-03-11 DIAGNOSIS — E782 Mixed hyperlipidemia: Secondary | ICD-10-CM | POA: Diagnosis not present

## 2024-03-11 DIAGNOSIS — N189 Chronic kidney disease, unspecified: Secondary | ICD-10-CM | POA: Diagnosis not present

## 2024-03-15 DIAGNOSIS — M79644 Pain in right finger(s): Secondary | ICD-10-CM | POA: Diagnosis not present

## 2024-03-23 DIAGNOSIS — R3912 Poor urinary stream: Secondary | ICD-10-CM | POA: Diagnosis not present

## 2024-03-23 DIAGNOSIS — R3915 Urgency of urination: Secondary | ICD-10-CM | POA: Diagnosis not present

## 2024-03-27 DIAGNOSIS — N189 Chronic kidney disease, unspecified: Secondary | ICD-10-CM | POA: Diagnosis not present

## 2024-04-02 DIAGNOSIS — N5203 Combined arterial insufficiency and corporo-venous occlusive erectile dysfunction: Secondary | ICD-10-CM | POA: Diagnosis not present

## 2024-04-02 DIAGNOSIS — E782 Mixed hyperlipidemia: Secondary | ICD-10-CM | POA: Diagnosis not present

## 2024-04-02 DIAGNOSIS — Z0000 Encounter for general adult medical examination without abnormal findings: Secondary | ICD-10-CM | POA: Diagnosis not present

## 2024-04-02 DIAGNOSIS — I10 Essential (primary) hypertension: Secondary | ICD-10-CM | POA: Diagnosis not present

## 2024-04-02 DIAGNOSIS — N189 Chronic kidney disease, unspecified: Secondary | ICD-10-CM | POA: Diagnosis not present

## 2024-04-02 DIAGNOSIS — Z23 Encounter for immunization: Secondary | ICD-10-CM | POA: Diagnosis not present

## 2024-04-02 DIAGNOSIS — N400 Enlarged prostate without lower urinary tract symptoms: Secondary | ICD-10-CM | POA: Diagnosis not present

## 2024-04-10 DIAGNOSIS — N400 Enlarged prostate without lower urinary tract symptoms: Secondary | ICD-10-CM | POA: Diagnosis not present

## 2024-04-10 DIAGNOSIS — E782 Mixed hyperlipidemia: Secondary | ICD-10-CM | POA: Diagnosis not present

## 2024-04-10 DIAGNOSIS — N189 Chronic kidney disease, unspecified: Secondary | ICD-10-CM | POA: Diagnosis not present

## 2024-04-26 DIAGNOSIS — N189 Chronic kidney disease, unspecified: Secondary | ICD-10-CM | POA: Diagnosis not present

## 2024-05-11 ENCOUNTER — Other Ambulatory Visit: Payer: Self-pay | Admitting: Medical Genetics

## 2024-05-11 DIAGNOSIS — N189 Chronic kidney disease, unspecified: Secondary | ICD-10-CM | POA: Diagnosis not present

## 2024-05-11 DIAGNOSIS — E782 Mixed hyperlipidemia: Secondary | ICD-10-CM | POA: Diagnosis not present

## 2024-05-11 DIAGNOSIS — Z006 Encounter for examination for normal comparison and control in clinical research program: Secondary | ICD-10-CM

## 2024-05-11 DIAGNOSIS — N400 Enlarged prostate without lower urinary tract symptoms: Secondary | ICD-10-CM | POA: Diagnosis not present

## 2024-05-26 DIAGNOSIS — N189 Chronic kidney disease, unspecified: Secondary | ICD-10-CM | POA: Diagnosis not present

## 2024-06-10 DIAGNOSIS — E782 Mixed hyperlipidemia: Secondary | ICD-10-CM | POA: Diagnosis not present

## 2024-06-10 DIAGNOSIS — N189 Chronic kidney disease, unspecified: Secondary | ICD-10-CM | POA: Diagnosis not present

## 2024-06-10 DIAGNOSIS — N400 Enlarged prostate without lower urinary tract symptoms: Secondary | ICD-10-CM | POA: Diagnosis not present

## 2024-06-19 DIAGNOSIS — R3912 Poor urinary stream: Secondary | ICD-10-CM | POA: Diagnosis not present

## 2024-06-19 DIAGNOSIS — N3281 Overactive bladder: Secondary | ICD-10-CM | POA: Diagnosis not present

## 2024-06-24 LAB — GENECONNECT MOLECULAR SCREEN: Genetic Analysis Overall Interpretation: NEGATIVE
